# Patient Record
Sex: Male | Born: 1940
Health system: Southern US, Community
[De-identification: ages and names within clinical notes are randomized; demographics above are authoritative.]

## PROBLEM LIST (undated history)

## (undated) DIAGNOSIS — G5601 Carpal tunnel syndrome, right upper limb: Secondary | ICD-10-CM

## (undated) DIAGNOSIS — I6522 Occlusion and stenosis of left carotid artery: Secondary | ICD-10-CM

## (undated) DIAGNOSIS — E7800 Pure hypercholesterolemia, unspecified: Secondary | ICD-10-CM

## (undated) DIAGNOSIS — K219 Gastro-esophageal reflux disease without esophagitis: Secondary | ICD-10-CM

## (undated) DIAGNOSIS — M7121 Synovial cyst of popliteal space [Baker], right knee: Secondary | ICD-10-CM

## (undated) DIAGNOSIS — M1990 Unspecified osteoarthritis, unspecified site: Secondary | ICD-10-CM

## (undated) DIAGNOSIS — J45909 Unspecified asthma, uncomplicated: Secondary | ICD-10-CM

## (undated) DIAGNOSIS — R1310 Dysphagia, unspecified: Secondary | ICD-10-CM

## (undated) DIAGNOSIS — N400 Enlarged prostate without lower urinary tract symptoms: Secondary | ICD-10-CM

## (undated) DIAGNOSIS — R634 Abnormal weight loss: Secondary | ICD-10-CM

## (undated) DIAGNOSIS — R42 Dizziness and giddiness: Secondary | ICD-10-CM

## (undated) DIAGNOSIS — J40 Bronchitis, not specified as acute or chronic: Secondary | ICD-10-CM

## (undated) DIAGNOSIS — N529 Male erectile dysfunction, unspecified: Secondary | ICD-10-CM

## (undated) DIAGNOSIS — G301 Alzheimer's disease with late onset: Secondary | ICD-10-CM

## (undated) DIAGNOSIS — M779 Enthesopathy, unspecified: Secondary | ICD-10-CM

## (undated) DIAGNOSIS — K2990 Gastroduodenitis, unspecified, without bleeding: Secondary | ICD-10-CM

## (undated) DIAGNOSIS — F0280 Dementia in other diseases classified elsewhere without behavioral disturbance: Secondary | ICD-10-CM

## (undated) DIAGNOSIS — J449 Chronic obstructive pulmonary disease, unspecified: Secondary | ICD-10-CM

## (undated) DIAGNOSIS — K222 Esophageal obstruction: Secondary | ICD-10-CM

## (undated) DIAGNOSIS — G5691 Unspecified mononeuropathy of right upper limb: Secondary | ICD-10-CM

## (undated) HISTORY — PX: COLONOSCOPY WITH ESOPHAGOGASTRODUODENOSCOPY (EGD) AND ESOPHAGEAL DILATION (ED): SHX6495

## (undated) HISTORY — PX: HERNIA REPAIR: SHX51

---

## 2004-06-19 ENCOUNTER — Other Ambulatory Visit: Payer: Self-pay

## 2004-06-27 ENCOUNTER — Encounter: Admission: RE | Admit: 2004-06-27 | Discharge: 2004-06-27 | Payer: Self-pay | Admitting: Neurosurgery

## 2004-08-23 ENCOUNTER — Encounter: Admission: RE | Admit: 2004-08-23 | Discharge: 2004-08-23 | Payer: Self-pay | Admitting: Neurosurgery

## 2005-01-23 ENCOUNTER — Ambulatory Visit: Payer: Self-pay | Admitting: Internal Medicine

## 2005-08-08 ENCOUNTER — Ambulatory Visit: Payer: Self-pay

## 2005-11-01 ENCOUNTER — Emergency Department: Payer: Self-pay | Admitting: Emergency Medicine

## 2006-03-28 ENCOUNTER — Other Ambulatory Visit: Payer: Self-pay

## 2006-04-04 ENCOUNTER — Ambulatory Visit: Payer: Self-pay | Admitting: General Surgery

## 2009-11-01 ENCOUNTER — Ambulatory Visit: Payer: Self-pay | Admitting: Family Medicine

## 2012-05-20 ENCOUNTER — Ambulatory Visit: Payer: Self-pay | Admitting: Pain Medicine

## 2012-05-28 ENCOUNTER — Ambulatory Visit: Payer: Self-pay | Admitting: Pain Medicine

## 2014-04-20 DIAGNOSIS — N529 Male erectile dysfunction, unspecified: Secondary | ICD-10-CM | POA: Insufficient documentation

## 2014-04-20 DIAGNOSIS — R1310 Dysphagia, unspecified: Secondary | ICD-10-CM | POA: Insufficient documentation

## 2014-05-07 ENCOUNTER — Ambulatory Visit: Payer: Self-pay | Admitting: Internal Medicine

## 2014-05-11 DIAGNOSIS — R12 Heartburn: Secondary | ICD-10-CM | POA: Insufficient documentation

## 2014-05-11 DIAGNOSIS — R634 Abnormal weight loss: Secondary | ICD-10-CM | POA: Insufficient documentation

## 2014-05-14 ENCOUNTER — Ambulatory Visit: Payer: Self-pay | Admitting: Unknown Physician Specialty

## 2014-05-27 ENCOUNTER — Ambulatory Visit: Payer: Self-pay | Admitting: Unknown Physician Specialty

## 2014-05-28 LAB — PATHOLOGY REPORT

## 2014-06-10 DIAGNOSIS — K2990 Gastroduodenitis, unspecified, without bleeding: Secondary | ICD-10-CM | POA: Insufficient documentation

## 2014-07-28 DIAGNOSIS — M4726 Other spondylosis with radiculopathy, lumbar region: Secondary | ICD-10-CM | POA: Insufficient documentation

## 2014-09-06 DIAGNOSIS — H6123 Impacted cerumen, bilateral: Secondary | ICD-10-CM | POA: Insufficient documentation

## 2014-09-06 DIAGNOSIS — M25521 Pain in right elbow: Secondary | ICD-10-CM | POA: Insufficient documentation

## 2014-09-06 DIAGNOSIS — Z720 Tobacco use: Secondary | ICD-10-CM | POA: Insufficient documentation

## 2014-09-14 ENCOUNTER — Ambulatory Visit: Payer: Self-pay | Admitting: Internal Medicine

## 2014-10-18 DIAGNOSIS — J449 Chronic obstructive pulmonary disease, unspecified: Secondary | ICD-10-CM | POA: Insufficient documentation

## 2015-12-02 DIAGNOSIS — F0280 Dementia in other diseases classified elsewhere without behavioral disturbance: Secondary | ICD-10-CM | POA: Diagnosis not present

## 2015-12-02 DIAGNOSIS — G301 Alzheimer's disease with late onset: Secondary | ICD-10-CM | POA: Diagnosis not present

## 2015-12-02 DIAGNOSIS — J3089 Other allergic rhinitis: Secondary | ICD-10-CM | POA: Insufficient documentation

## 2015-12-02 DIAGNOSIS — R42 Dizziness and giddiness: Secondary | ICD-10-CM | POA: Insufficient documentation

## 2015-12-15 DIAGNOSIS — R42 Dizziness and giddiness: Secondary | ICD-10-CM | POA: Diagnosis not present

## 2015-12-15 DIAGNOSIS — I6523 Occlusion and stenosis of bilateral carotid arteries: Secondary | ICD-10-CM | POA: Diagnosis not present

## 2015-12-19 DIAGNOSIS — N529 Male erectile dysfunction, unspecified: Secondary | ICD-10-CM | POA: Diagnosis not present

## 2015-12-19 DIAGNOSIS — I6522 Occlusion and stenosis of left carotid artery: Secondary | ICD-10-CM | POA: Insufficient documentation

## 2015-12-19 DIAGNOSIS — E7800 Pure hypercholesterolemia, unspecified: Secondary | ICD-10-CM | POA: Diagnosis not present

## 2015-12-19 DIAGNOSIS — Z1159 Encounter for screening for other viral diseases: Secondary | ICD-10-CM | POA: Diagnosis not present

## 2015-12-19 DIAGNOSIS — F0280 Dementia in other diseases classified elsewhere without behavioral disturbance: Secondary | ICD-10-CM | POA: Diagnosis not present

## 2015-12-19 DIAGNOSIS — Z125 Encounter for screening for malignant neoplasm of prostate: Secondary | ICD-10-CM | POA: Diagnosis not present

## 2015-12-19 DIAGNOSIS — G301 Alzheimer's disease with late onset: Secondary | ICD-10-CM | POA: Diagnosis not present

## 2015-12-30 DIAGNOSIS — I6523 Occlusion and stenosis of bilateral carotid arteries: Secondary | ICD-10-CM | POA: Diagnosis not present

## 2015-12-30 DIAGNOSIS — E785 Hyperlipidemia, unspecified: Secondary | ICD-10-CM | POA: Diagnosis not present

## 2015-12-30 DIAGNOSIS — F17200 Nicotine dependence, unspecified, uncomplicated: Secondary | ICD-10-CM | POA: Diagnosis not present

## 2015-12-30 DIAGNOSIS — J449 Chronic obstructive pulmonary disease, unspecified: Secondary | ICD-10-CM | POA: Diagnosis not present

## 2016-01-10 DIAGNOSIS — J209 Acute bronchitis, unspecified: Secondary | ICD-10-CM | POA: Diagnosis not present

## 2016-03-12 DIAGNOSIS — E7800 Pure hypercholesterolemia, unspecified: Secondary | ICD-10-CM | POA: Diagnosis not present

## 2016-03-12 DIAGNOSIS — Z1159 Encounter for screening for other viral diseases: Secondary | ICD-10-CM | POA: Diagnosis not present

## 2016-03-12 DIAGNOSIS — Z125 Encounter for screening for malignant neoplasm of prostate: Secondary | ICD-10-CM | POA: Diagnosis not present

## 2016-03-19 DIAGNOSIS — J449 Chronic obstructive pulmonary disease, unspecified: Secondary | ICD-10-CM | POA: Diagnosis not present

## 2016-03-19 DIAGNOSIS — Z1211 Encounter for screening for malignant neoplasm of colon: Secondary | ICD-10-CM | POA: Diagnosis not present

## 2016-03-19 DIAGNOSIS — I6522 Occlusion and stenosis of left carotid artery: Secondary | ICD-10-CM | POA: Diagnosis not present

## 2016-03-19 DIAGNOSIS — G301 Alzheimer's disease with late onset: Secondary | ICD-10-CM | POA: Diagnosis not present

## 2016-03-19 DIAGNOSIS — F0280 Dementia in other diseases classified elsewhere without behavioral disturbance: Secondary | ICD-10-CM | POA: Diagnosis not present

## 2016-03-19 DIAGNOSIS — N529 Male erectile dysfunction, unspecified: Secondary | ICD-10-CM | POA: Diagnosis not present

## 2016-03-19 DIAGNOSIS — E7800 Pure hypercholesterolemia, unspecified: Secondary | ICD-10-CM | POA: Diagnosis not present

## 2016-04-03 DIAGNOSIS — K5909 Other constipation: Secondary | ICD-10-CM | POA: Diagnosis not present

## 2016-04-17 DIAGNOSIS — X32XXXA Exposure to sunlight, initial encounter: Secondary | ICD-10-CM | POA: Diagnosis not present

## 2016-04-17 DIAGNOSIS — L570 Actinic keratosis: Secondary | ICD-10-CM | POA: Diagnosis not present

## 2016-04-17 DIAGNOSIS — L814 Other melanin hyperpigmentation: Secondary | ICD-10-CM | POA: Diagnosis not present

## 2016-05-10 DIAGNOSIS — M79601 Pain in right arm: Secondary | ICD-10-CM | POA: Diagnosis not present

## 2016-06-01 DIAGNOSIS — M778 Other enthesopathies, not elsewhere classified: Secondary | ICD-10-CM | POA: Diagnosis not present

## 2016-06-01 DIAGNOSIS — R634 Abnormal weight loss: Secondary | ICD-10-CM | POA: Diagnosis not present

## 2016-06-01 DIAGNOSIS — G301 Alzheimer's disease with late onset: Secondary | ICD-10-CM | POA: Diagnosis not present

## 2016-06-01 DIAGNOSIS — F0280 Dementia in other diseases classified elsewhere without behavioral disturbance: Secondary | ICD-10-CM | POA: Diagnosis not present

## 2016-06-01 DIAGNOSIS — J449 Chronic obstructive pulmonary disease, unspecified: Secondary | ICD-10-CM | POA: Diagnosis not present

## 2016-06-02 DIAGNOSIS — M778 Other enthesopathies, not elsewhere classified: Secondary | ICD-10-CM | POA: Insufficient documentation

## 2016-06-12 DIAGNOSIS — M778 Other enthesopathies, not elsewhere classified: Secondary | ICD-10-CM | POA: Diagnosis not present

## 2016-06-12 DIAGNOSIS — M1811 Unilateral primary osteoarthritis of first carpometacarpal joint, right hand: Secondary | ICD-10-CM | POA: Diagnosis not present

## 2016-07-03 DIAGNOSIS — J40 Bronchitis, not specified as acute or chronic: Secondary | ICD-10-CM | POA: Diagnosis not present

## 2016-07-03 DIAGNOSIS — E7800 Pure hypercholesterolemia, unspecified: Secondary | ICD-10-CM | POA: Diagnosis not present

## 2016-07-03 DIAGNOSIS — R5383 Other fatigue: Secondary | ICD-10-CM | POA: Diagnosis not present

## 2016-07-03 DIAGNOSIS — F17200 Nicotine dependence, unspecified, uncomplicated: Secondary | ICD-10-CM | POA: Diagnosis not present

## 2016-07-03 DIAGNOSIS — R634 Abnormal weight loss: Secondary | ICD-10-CM | POA: Diagnosis not present

## 2016-07-03 DIAGNOSIS — R05 Cough: Secondary | ICD-10-CM | POA: Diagnosis not present

## 2016-07-03 DIAGNOSIS — I6523 Occlusion and stenosis of bilateral carotid arteries: Secondary | ICD-10-CM | POA: Diagnosis not present

## 2016-07-03 DIAGNOSIS — E785 Hyperlipidemia, unspecified: Secondary | ICD-10-CM | POA: Diagnosis not present

## 2016-07-03 DIAGNOSIS — I6522 Occlusion and stenosis of left carotid artery: Secondary | ICD-10-CM | POA: Diagnosis not present

## 2016-07-03 DIAGNOSIS — J449 Chronic obstructive pulmonary disease, unspecified: Secondary | ICD-10-CM | POA: Diagnosis not present

## 2016-07-24 DIAGNOSIS — G5691 Unspecified mononeuropathy of right upper limb: Secondary | ICD-10-CM | POA: Insufficient documentation

## 2016-07-24 DIAGNOSIS — I6522 Occlusion and stenosis of left carotid artery: Secondary | ICD-10-CM | POA: Diagnosis not present

## 2016-07-24 DIAGNOSIS — G301 Alzheimer's disease with late onset: Secondary | ICD-10-CM | POA: Diagnosis not present

## 2016-07-24 DIAGNOSIS — E7800 Pure hypercholesterolemia, unspecified: Secondary | ICD-10-CM | POA: Diagnosis not present

## 2016-07-24 DIAGNOSIS — F0280 Dementia in other diseases classified elsewhere without behavioral disturbance: Secondary | ICD-10-CM | POA: Diagnosis not present

## 2016-08-08 DIAGNOSIS — G5601 Carpal tunnel syndrome, right upper limb: Secondary | ICD-10-CM | POA: Diagnosis not present

## 2016-09-04 DIAGNOSIS — G5601 Carpal tunnel syndrome, right upper limb: Secondary | ICD-10-CM | POA: Diagnosis not present

## 2016-09-13 DIAGNOSIS — S39012A Strain of muscle, fascia and tendon of lower back, initial encounter: Secondary | ICD-10-CM | POA: Diagnosis not present

## 2016-09-13 DIAGNOSIS — N529 Male erectile dysfunction, unspecified: Secondary | ICD-10-CM | POA: Diagnosis not present

## 2016-09-13 DIAGNOSIS — G5601 Carpal tunnel syndrome, right upper limb: Secondary | ICD-10-CM | POA: Insufficient documentation

## 2016-10-02 DIAGNOSIS — G5603 Carpal tunnel syndrome, bilateral upper limbs: Secondary | ICD-10-CM | POA: Diagnosis not present

## 2016-10-02 DIAGNOSIS — M25531 Pain in right wrist: Secondary | ICD-10-CM | POA: Diagnosis not present

## 2016-10-24 ENCOUNTER — Emergency Department
Admission: EM | Admit: 2016-10-24 | Discharge: 2016-10-24 | Disposition: A | Discharge status: Home / Self Care | Payer: PPO | Attending: Emergency Medicine | Admitting: Emergency Medicine

## 2016-10-24 DIAGNOSIS — F17200 Nicotine dependence, unspecified, uncomplicated: Secondary | ICD-10-CM | POA: Insufficient documentation

## 2016-10-24 DIAGNOSIS — K222 Esophageal obstruction: Secondary | ICD-10-CM | POA: Diagnosis not present

## 2016-10-24 DIAGNOSIS — Z79899 Other long term (current) drug therapy: Secondary | ICD-10-CM | POA: Insufficient documentation

## 2016-10-24 DIAGNOSIS — J449 Chronic obstructive pulmonary disease, unspecified: Secondary | ICD-10-CM | POA: Insufficient documentation

## 2016-10-24 DIAGNOSIS — R1310 Dysphagia, unspecified: Secondary | ICD-10-CM | POA: Diagnosis not present

## 2016-10-24 LAB — CBC
HEMATOCRIT: 47.4 % (ref 40.0–52.0)
HEMOGLOBIN: 16.3 g/dL (ref 13.0–18.0)
MCH: 30.8 pg (ref 26.0–34.0)
MCHC: 34.4 g/dL (ref 32.0–36.0)
MCV: 89.4 fL (ref 80.0–100.0)
Platelets: 270 10*3/uL (ref 150–440)
RBC: 5.3 MIL/uL (ref 4.40–5.90)
RDW: 13.3 % (ref 11.5–14.5)
WBC: 7.7 10*3/uL (ref 3.8–10.6)

## 2016-10-24 LAB — COMPREHENSIVE METABOLIC PANEL
ALBUMIN: 4.1 g/dL (ref 3.5–5.0)
ALK PHOS: 68 U/L (ref 38–126)
ALT: 22 U/L (ref 17–63)
ANION GAP: 5 (ref 5–15)
AST: 32 U/L (ref 15–41)
BILIRUBIN TOTAL: 1 mg/dL (ref 0.3–1.2)
BUN: 24 mg/dL — ABNORMAL HIGH (ref 6–20)
CALCIUM: 9.7 mg/dL (ref 8.9–10.3)
CO2: 27 mmol/L (ref 22–32)
CREATININE: 1.13 mg/dL (ref 0.61–1.24)
Chloride: 109 mmol/L (ref 101–111)
GFR calc non Af Amer: >60 mL/min (ref >60)
GLUCOSE: 98 mg/dL (ref 65–99)
Potassium: 4.4 mmol/L (ref 3.5–5.1)
Sodium: 141 mmol/L (ref 135–145)
TOTAL PROTEIN: 6.7 g/dL (ref 6.5–8.1)

## 2016-10-24 LAB — TROPONIN I: Troponin I: <0.03 ng/mL (ref <0.03)

## 2016-10-24 NOTE — ED Triage Notes (Signed)
Reports trouble in the past with getting food stuck in throat.  States food has been stuck x 2 wks now but getting more difficult to swallow.  No resp distress, no drooling.

## 2016-10-24 NOTE — Discharge Instructions (Signed)
Please eat a full liquid diet, Dr. Percell [place] office will contact you

## 2016-10-24 NOTE — ED Provider Notes (Signed)
[facility] Emergency Department Provider Note   ____________________________________________    I have reviewed the triage vital signs and the nursing notes.   HISTORY  Chief Complaint Dysphagia     HPI James [name] is a 75 y.o. male who presents with complaints of difficulty swallowing. Patient reports he has a long history of swallowing difficulty and has had his esophagus stretched twice before by Dr. Vira Agar. He reports his swallowing has gotten worse over the last 2 weeks and today he thought food was stuck but he was able to get through. He had a mild burning pain as well which has resolved. He reports he is able to get liquid through slowly and generally chews his food very well. He denies nausea or vomiting. No chest pain. He feels well currently and has no complaints. They called GI office this morning who referred them to the emergency department   No past medical history on file.  There are no active problems to display for this patient.   No past surgical history on file.  Prior to Admission medications   Medication Sig Start Date End Date Taking? Authorizing Provider  donepezil (ARICEPT) 23 MG TABS tablet Take 23 mg by mouth at bedtime. 07/24/16 07/24/17  Historical Provider, MD  pravastatin (PRAVACHOL) 10 MG tablet Take 10 mg by mouth at bedtime. 07/24/16 07/24/17  Historical Provider, MD  tiZANidine (ZANAFLEX) 2 MG tablet Take 2 mg by mouth 3 (three) times daily. 09/13/16 09/13/17  Historical Provider, MD     Allergies Aspirin  No family history on file.  Social History Social History  Substance Use Topics  . Smoking status: Not on file  . Smokeless tobacco: Not on file  . Alcohol use Not on file    Review of Systems  Constitutional: No fever/chills  ENT: No sore throat. Cardiovascular: As above Respiratory: Denies shortness of breath. Gastrointestinal: No abdominal pain.  No nausea, no vomiting.    Musculoskeletal:  Negative for back pain. Skin: Negative for rash.   10-point ROS otherwise negative.  ____________________________________________   PHYSICAL EXAM:  VITAL SIGNS: ED Triage Vitals  Enc Vitals Group     BP 10/24/16 0928 134/80     Pulse Rate 10/24/16 0928 79     Resp 10/24/16 0928 18     Temp 10/24/16 0928 98.1 F (36.7 C)     Temp Source 10/24/16 0928 Oral     SpO2 10/24/16 0928 98 %     Weight 10/24/16 0929 145 lb (65.8 kg)     Height 10/24/16 0929 5\' 11"  (1.803 m)     Head Circumference --      Peak Flow --      Pain Score 10/24/16 0929 0     Pain Loc --      Pain Edu? --      Excl. in [place]? --     Constitutional: Alert and oriented. No acute distress. Pleasant and interactive Eyes: Conjunctivae are normal.   Nose: No congestion/rhinnorhea. Mouth/Throat: Mucous membranes are moist.    Cardiovascular: Normal rate, regular rhythm.   Good peripheral circulation. Respiratory: Normal respiratory effort.  No retractions. Gastrointestinal: Soft and nontender. No distention.  No CVA tenderness. Genitourinary: deferred Musculoskeletal: No lower extremity tenderness nor edema.  Warm and well perfused Neurologic:  Normal speech and language. No gross focal neurologic deficits are appreciated.  Skin:  Skin is warm, dry and intact. No rash noted. Psychiatric: Mood and affect are normal. Speech and  behavior are normal.  ____________________________________________   LABS (all labs ordered are listed, but only abnormal results are displayed)  Labs Reviewed  COMPREHENSIVE METABOLIC PANEL - Abnormal; Notable for the following:       Result Value   BUN 24 (*)    All other components within normal limits  CBC  TROPONIN I   ____________________________________________  EKG  ED ECG REPORT I, Lavonia Drafts, the attending physician, personally viewed and interpreted this ECG.  Date: 10/24/2016 EKG Time: 9:42 AM Rate: 68 Rhythm: normal sinus rhythm QRS Axis:  normal Intervals: normal ST/T Wave abnormalities: normal Conduction Disturbances: none Narrative Interpretation: unremarkable  ____________________________________________  RADIOLOGY  none ____________________________________________   PROCEDURES  Procedure(s) performed: No    Critical Care performed: No ____________________________________________   INITIAL IMPRESSION / ASSESSMENT AND PLAN / ED COURSE  Pertinent labs & imaging results that were available during my care of the patient were reviewed by me and considered in my medical decision making (see chart for details).  Patient well-appearing and in no acute distress. He is quite comfortable in the ED. We will check labs, including troponin and EKG although very much doubt ACS, this is quite consistent with temporary food impaction. If labs unremarkable will refer for GI outpatient procedure   Clinical Course   Patient is doing well and these be asymptomatic. Lab work is unremarkable. EKG is normal. We'll discuss with GI and arrange outpatient follow-up.  Discussed with Dr. Vira Agar his office will arrange follow up ____________________________________________   FINAL CLINICAL IMPRESSION(S) / ED DIAGNOSES  Final diagnoses:  Esophageal stricture      NEW MEDICATIONS STARTED DURING THIS VISIT:  New Prescriptions   No medications on file     Note:  This document was prepared using Dragon voice recognition software and may include unintentional dictation errors.    Lavonia Drafts, MD 10/24/16 [phone]

## 2016-10-24 NOTE — ED Notes (Signed)
ED Provider at bedside. 

## 2016-10-25 ENCOUNTER — Encounter: Payer: Self-pay | Admitting: *Deleted

## 2016-10-25 DIAGNOSIS — K219 Gastro-esophageal reflux disease without esophagitis: Secondary | ICD-10-CM | POA: Diagnosis not present

## 2016-10-25 DIAGNOSIS — R1310 Dysphagia, unspecified: Secondary | ICD-10-CM | POA: Diagnosis not present

## 2016-10-25 DIAGNOSIS — K222 Esophageal obstruction: Secondary | ICD-10-CM | POA: Diagnosis not present

## 2016-10-26 ENCOUNTER — Ambulatory Visit
Admission: RE | Admit: 2016-10-26 | Discharge: 2016-10-26 | Disposition: A | Discharge status: Home / Self Care | Payer: PPO | Source: Ambulatory Visit | Attending: Unknown Physician Specialty | Admitting: Unknown Physician Specialty

## 2016-10-26 ENCOUNTER — Encounter: Payer: Self-pay | Admitting: Anesthesiology

## 2016-10-26 ENCOUNTER — Ambulatory Visit: Payer: PPO | Admitting: Anesthesiology

## 2016-10-26 ENCOUNTER — Encounter
Admission: RE | Disposition: A | Discharge status: Home / Self Care | Payer: Self-pay | Source: Ambulatory Visit | Attending: Unknown Physician Specialty

## 2016-10-26 DIAGNOSIS — K2950 Unspecified chronic gastritis without bleeding: Secondary | ICD-10-CM | POA: Diagnosis not present

## 2016-10-26 DIAGNOSIS — G301 Alzheimer's disease with late onset: Secondary | ICD-10-CM | POA: Insufficient documentation

## 2016-10-26 DIAGNOSIS — K219 Gastro-esophageal reflux disease without esophagitis: Secondary | ICD-10-CM | POA: Diagnosis not present

## 2016-10-26 DIAGNOSIS — F419 Anxiety disorder, unspecified: Secondary | ICD-10-CM | POA: Insufficient documentation

## 2016-10-26 DIAGNOSIS — Z7982 Long term (current) use of aspirin: Secondary | ICD-10-CM | POA: Diagnosis not present

## 2016-10-26 DIAGNOSIS — K2970 Gastritis, unspecified, without bleeding: Secondary | ICD-10-CM | POA: Diagnosis not present

## 2016-10-26 DIAGNOSIS — N529 Male erectile dysfunction, unspecified: Secondary | ICD-10-CM | POA: Insufficient documentation

## 2016-10-26 DIAGNOSIS — F0280 Dementia in other diseases classified elsewhere without behavioral disturbance: Secondary | ICD-10-CM | POA: Insufficient documentation

## 2016-10-26 DIAGNOSIS — J449 Chronic obstructive pulmonary disease, unspecified: Secondary | ICD-10-CM | POA: Insufficient documentation

## 2016-10-26 DIAGNOSIS — E7800 Pure hypercholesterolemia, unspecified: Secondary | ICD-10-CM | POA: Insufficient documentation

## 2016-10-26 DIAGNOSIS — F17210 Nicotine dependence, cigarettes, uncomplicated: Secondary | ICD-10-CM | POA: Diagnosis not present

## 2016-10-26 DIAGNOSIS — K222 Esophageal obstruction: Secondary | ICD-10-CM | POA: Insufficient documentation

## 2016-10-26 DIAGNOSIS — M1990 Unspecified osteoarthritis, unspecified site: Secondary | ICD-10-CM | POA: Diagnosis not present

## 2016-10-26 DIAGNOSIS — K269 Duodenal ulcer, unspecified as acute or chronic, without hemorrhage or perforation: Secondary | ICD-10-CM | POA: Diagnosis not present

## 2016-10-26 DIAGNOSIS — R1310 Dysphagia, unspecified: Secondary | ICD-10-CM | POA: Diagnosis not present

## 2016-10-26 HISTORY — DX: Alzheimer's disease with late onset: G30.1

## 2016-10-26 HISTORY — DX: Unspecified osteoarthritis, unspecified site: M19.90

## 2016-10-26 HISTORY — DX: Enthesopathy, unspecified: M77.9

## 2016-10-26 HISTORY — DX: Male erectile dysfunction, unspecified: N52.9

## 2016-10-26 HISTORY — DX: Pure hypercholesterolemia, unspecified: E78.00

## 2016-10-26 HISTORY — DX: Gastro-esophageal reflux disease without esophagitis: K21.9

## 2016-10-26 HISTORY — DX: Abnormal weight loss: R63.4

## 2016-10-26 HISTORY — DX: Dysphagia, unspecified: R13.10

## 2016-10-26 HISTORY — DX: Bronchitis, not specified as acute or chronic: J40

## 2016-10-26 HISTORY — DX: Chronic obstructive pulmonary disease, unspecified: J44.9

## 2016-10-26 HISTORY — PX: ESOPHAGOGASTRODUODENOSCOPY (EGD) WITH PROPOFOL: SHX5813

## 2016-10-26 HISTORY — PX: SAVORY DILATION: SHX5439

## 2016-10-26 HISTORY — DX: Dementia in other diseases classified elsewhere without behavioral disturbance: F02.80

## 2016-10-26 SURGERY — ESOPHAGOGASTRODUODENOSCOPY (EGD) WITH PROPOFOL
Anesthesia: General

## 2016-10-26 MED ORDER — SODIUM CHLORIDE 0.9 % IV SOLN: INTRAVENOUS | Status: DC

## 2016-10-26 MED ORDER — LIDOCAINE VISCOUS 2 % MT SOLN
15.0000 mL | Freq: Once | OROMUCOSAL | Status: AC
  Administered 2016-10-26: 15 mL via OROMUCOSAL
  Filled 2016-10-26: qty 15

## 2016-10-26 MED ORDER — SODIUM CHLORIDE 0.9 % IV SOLN
INTRAVENOUS | Status: DC
  Administered 2016-10-26: 14:00:00 via INTRAVENOUS

## 2016-10-26 MED ORDER — PROPOFOL 500 MG/50ML IV EMUL
INTRAVENOUS | Status: AC
  Filled 2016-10-26: qty 50

## 2016-10-26 MED ORDER — LIDOCAINE 2% (20 MG/ML) 5 ML SYRINGE
INTRAMUSCULAR | Status: DC | PRN
  Administered 2016-10-26: 50 mg via INTRAVENOUS

## 2016-10-26 MED ORDER — LIDOCAINE 2% (20 MG/ML) 5 ML SYRINGE
INTRAMUSCULAR | Status: AC
  Filled 2016-10-26: qty 5

## 2016-10-26 MED ORDER — PROPOFOL 500 MG/50ML IV EMUL
INTRAVENOUS | Status: DC | PRN
Start: 2016-10-26 — End: 2016-10-26
  Administered 2016-10-26: 120 ug/kg/min via INTRAVENOUS

## 2016-10-26 MED ORDER — GLYCOPYRROLATE 0.2 MG/ML IJ SOLN
INTRAMUSCULAR | Status: DC | PRN
  Administered 2016-10-26: 0.2 mg via INTRAVENOUS

## 2016-10-26 MED ORDER — GLYCOPYRROLATE 0.2 MG/ML IJ SOLN
INTRAMUSCULAR | Status: AC
  Filled 2016-10-26: qty 1

## 2016-10-26 MED ORDER — PROPOFOL 10 MG/ML IV BOLUS
INTRAVENOUS | Status: DC | PRN
  Administered 2016-10-26: 70 mg via INTRAVENOUS
  Administered 2016-10-26: 30 mg via INTRAVENOUS

## 2016-10-26 MED ORDER — SODIUM CHLORIDE 0.9 % IV SOLN
INTRAVENOUS | Status: DC
  Administered 2016-10-26: 13:00:00 via INTRAVENOUS

## 2016-10-26 NOTE — Progress Notes (Signed)
Patient c/o pain s/p procedure Dr.Elliott into assess patient avss at this time - family present at beside MIVF continues - see MAR for full details

## 2016-10-26 NOTE — Progress Notes (Signed)
Diet order Clears for 24hrs - room temp h20 and apple juice Soft for 1 week - potato - MD and Nurse discussed food options with family and patient Prescriptions given per MD

## 2016-10-26 NOTE — H&P (Signed)
Primary Care Physician:  No PCP Per Patient Primary Gastroenterologist:  Dr. Vira Agar  Pre-Procedure History & Physical: HPI:  James [name] is a 75 y.o. male is here for an endoscopy.   Past Medical History:  Diagnosis Date  . Arthritis   . Bronchitis   . COPD (chronic obstructive pulmonary disease) ([place])   . Dysphagia   . Elevated cholesterol   . Erectile dysfunction   . GERD (gastroesophageal reflux disease)   . SDAT (senile dementia of Alzheimer's type)   . Tendinitis   . Weight loss     Past Surgical History:  Procedure Laterality Date  . COLONOSCOPY WITH ESOPHAGOGASTRODUODENOSCOPY (EGD) AND ESOPHAGEAL DILATION (ED)    . HERNIA REPAIR      Prior to Admission medications   Medication Sig Start Date End Date Taking? Authorizing Provider  albuterol (PROVENTIL) (2.5 MG/3ML) 0.083% nebulizer solution Take 2.5 mg by nebulization every 6 (six) hours as needed for wheezing or shortness of breath.   Yes Historical Provider, MD  predniSONE (DELTASONE) 20 MG tablet Take 20 mg by mouth daily with breakfast.   Yes Historical Provider, MD  donepezil (ARICEPT) 23 MG TABS tablet Take 23 mg by mouth at bedtime. 07/24/16 07/24/17  Historical Provider, MD  omeprazole (PRILOSEC) 20 MG capsule Take 20 mg by mouth daily.    Historical Provider, MD  pravastatin (PRAVACHOL) 10 MG tablet Take 10 mg by mouth at bedtime. 07/24/16 07/24/17  Historical Provider, MD  tadalafil (CIALIS) 5 MG tablet Take 5 mg by mouth daily as needed for erectile dysfunction.    Historical Provider, MD  tiotropium (SPIRIVA) 18 MCG inhalation capsule Place 18 mcg into inhaler and inhale daily.    Historical Provider, MD  tiZANidine (ZANAFLEX) 2 MG tablet Take 2 mg by mouth 3 (three) times daily. 09/13/16 09/13/17  Historical Provider, MD    Allergies as of 10/25/2016 - Review Complete 10/25/2016  Allergen Reaction Noted  . Aspirin Swelling 10/24/2016    History reviewed. No pertinent family history.  Social History    Social History  . Marital status: Unknown    Spouse name: N/A  . Number of children: N/A  . Years of education: N/A   Occupational History  . Not on file.   Social History Main Topics  . Smoking status: Current Every Day Smoker    Packs/day: 0.50  . Smokeless tobacco: Never Used  . Alcohol use Not on file  . Drug use: No  . Sexual activity: Not on file   Other Topics Concern  . Not on file   Social History Narrative  . No narrative on file    Review of Systems: See HPI, otherwise negative ROS  Physical Exam: BP (!) 142/86   Pulse 64   Temp (!) 96.5 F (35.8 C)   Resp 17   Ht 5\' 11"  (1.803 m)   Wt 66.2 kg (146 lb)   SpO2 100%   BMI 20.36 kg/m  General:   Alert,  pleasant and cooperative in NAD Head:  Normocephalic and atraumatic. Neck:  Supple; no masses or thyromegaly. Lungs:  Clear throughout to auscultation.    Heart:  Regular rate and rhythm. Abdomen:  Soft, nontender and nondistended. Normal bowel sounds, without guarding, and without rebound.   Neurologic:  Alert and  oriented x4;  grossly normal neurologically.  Impression/Plan: James [name] is here for an endoscopy to be performed for dysphagia and needs dilatation.   Risks, benefits, limitations, and alternatives regarding  endoscopy  have been reviewed with the patient.  Questions have been answered.  All parties agreeable.   Gaylyn Cheers, MD  10/26/2016, 4:52 PM

## 2016-10-26 NOTE — Anesthesia Postprocedure Evaluation (Signed)
Anesthesia Post Note  Patient: James [name]  Procedure(s) Performed: Procedure(s) (LRB): ESOPHAGOGASTRODUODENOSCOPY (EGD) WITH PROPOFOL (N/A) SAVORY DILATION (N/A)  Patient location during evaluation: Endoscopy Anesthesia Type: General Level of consciousness: awake and alert Pain management: pain level controlled Vital Signs Assessment: post-procedure vital signs reviewed and stable Respiratory status: spontaneous breathing, nonlabored ventilation, respiratory function stable and patient connected to nasal cannula oxygen Cardiovascular status: blood pressure returned to baseline and stable Postop Assessment: no signs of nausea or vomiting Anesthetic complications: no     Last Vitals:  Vitals:   10/26/16 1540 10/26/16 1550  BP:    Pulse: 65 64  Resp: 16 17  Temp:      Last Pain:  Vitals:   10/26/16 1500  TempSrc:   PainSc: [name]

## 2016-10-26 NOTE — Transfer of Care (Signed)
Immediate Anesthesia Transfer of Care Note  Patient: James [name]  Procedure(s) Performed: Procedure(s): ESOPHAGOGASTRODUODENOSCOPY (EGD) WITH PROPOFOL (N/A) SAVORY DILATION (N/A)  Patient Location: Endoscopy Unit  Anesthesia Type:General  Level of Consciousness: awake  Airway & Oxygen Therapy: Patient Spontanous Breathing and Patient connected to nasal cannula oxygen  Post-op Assessment: Report given to RN and Post -op Vital signs reviewed and stable  Post vital signs: Reviewed  Last Vitals:  Vitals:   10/26/16 1256 10/26/16 1412  BP: (!) 142/90 (!) 159/87  Pulse: 81 82  Resp:  16  Temp: (!) 35.4 C (!) 35.9 C    Last Pain:  Vitals:   10/26/16 1256  TempSrc: Tympanic         Complications: No apparent anesthesia complications

## 2016-10-26 NOTE — Progress Notes (Signed)
Dr. Vira Agar at beside patient to remain on clear liquids at this time - patient to remain in recovery to monitor per MD

## 2016-10-26 NOTE — Op Note (Signed)
[facility] Patient Name: James [name] Procedure Date: 10/26/2016 1:41 PM MRN: [id number] Account #: [id number] Date of Birth: November 23, 1940 Admit Type: Outpatient Age: 75 Room: [facility] ENDO ROOM 1 Gender: Male Note Status: Finalized Procedure:            Upper GI endoscopy Indications:          Dysphagia Providers:            Manya Silvas, MD Referring MD:         Glendon Axe (Referring MD) Medicines:            Propofol per Anesthesia Complications:        No immediate complications. Procedure:            Pre-Anesthesia Assessment:                       - After reviewing the risks and benefits, the patient                        was deemed in satisfactory condition to undergo the                        procedure.                       After obtaining informed consent, the endoscope was                        passed under direct vision. Throughout the procedure,                        the patient's blood pressure, pulse, and oxygen                        saturations were monitored continuously. The                        Colonoscope was introduced through the mouth, and                        advanced to the second part of duodenum. The upper GI                        endoscopy was accomplished without difficulty. The                        patient tolerated the procedure well. Findings:      A moderate Schatzki ring (acquired) was found at the gastroesophageal       junction. At the end of the procedure A guidewire was placed and the       scope was withdrawn. Dilation was performed with a Savary dilator with       moderate resistance at 15 mm, 16 mm and 17 mm.      Diffuse and patchy mild inflammation characterized by erythema and       granularity was found in the gastric body.      Many non-bleeding superficial duodenal ulcers with no stigmata of       bleeding were found in the duodenal bulb. The largest lesion was 3 mm in   largest dimension. Impression:           -  Moderate Schatzki ring. Dilated.                       - Gastritis.                       - Multiple non-bleeding duodenal ulcers with no                        stigmata of bleeding.                       - No specimens collected. Recommendation:       - The findings and recommendations were discussed with                        the patient's family. Very soft food, take medicine to                        stop acid production. Manya Silvas, MD 10/26/2016 2:12:22 PM This report has been signed electronically. Number of Addenda: 0 Note Initiated On: 10/26/2016 1:41 PM      [facility]

## 2016-10-26 NOTE — Anesthesia Preprocedure Evaluation (Addendum)
Anesthesia Evaluation  Patient identified by MRN, date of birth, ID band Patient awake    Reviewed: Allergy & Precautions, NPO status , Patient's Chart, lab work & pertinent test results, reviewed documented beta blocker date and time   Airway Mallampati: II  TM Distance: >3 FB     Dental  (+) Chipped, Partial Upper   Pulmonary COPD, Current Smoker,           Cardiovascular      Neuro/Psych Anxiety    GI/Hepatic GERD  ,  Endo/Other    Renal/GU      Musculoskeletal  (+) Arthritis ,   Abdominal   Peds  Hematology   Anesthesia Other Findings   Reproductive/Obstetrics                            Anesthesia Physical Anesthesia Plan  ASA: III  Anesthesia Plan: General   Post-op Pain Management:    Induction: Intravenous  Airway Management Planned: Nasal Cannula  Additional Equipment:   Intra-op Plan:   Post-operative Plan:   Informed Consent: I have reviewed the patients History and Physical, chart, labs and discussed the procedure including the risks, benefits and alternatives for the proposed anesthesia with the patient or authorized representative who has indicated his/her understanding and acceptance.     Plan Discussed with: CRNA  Anesthesia Plan Comments:         Anesthesia Quick Evaluation

## 2016-10-30 ENCOUNTER — Encounter: Payer: Self-pay | Admitting: Unknown Physician Specialty

## 2016-11-23 DIAGNOSIS — G5601 Carpal tunnel syndrome, right upper limb: Secondary | ICD-10-CM | POA: Diagnosis not present

## 2016-12-21 DIAGNOSIS — E7800 Pure hypercholesterolemia, unspecified: Secondary | ICD-10-CM | POA: Diagnosis not present

## 2016-12-21 DIAGNOSIS — M5442 Lumbago with sciatica, left side: Secondary | ICD-10-CM | POA: Diagnosis not present

## 2016-12-21 DIAGNOSIS — G8929 Other chronic pain: Secondary | ICD-10-CM | POA: Diagnosis not present

## 2016-12-21 DIAGNOSIS — I6522 Occlusion and stenosis of left carotid artery: Secondary | ICD-10-CM | POA: Diagnosis not present

## 2016-12-21 DIAGNOSIS — M545 Low back pain: Secondary | ICD-10-CM | POA: Diagnosis not present

## 2016-12-21 DIAGNOSIS — J441 Chronic obstructive pulmonary disease with (acute) exacerbation: Secondary | ICD-10-CM | POA: Diagnosis not present

## 2016-12-24 ENCOUNTER — Other Ambulatory Visit: Payer: Self-pay | Admitting: Internal Medicine

## 2016-12-24 DIAGNOSIS — M4726 Other spondylosis with radiculopathy, lumbar region: Secondary | ICD-10-CM

## 2016-12-29 NOTE — Discharge Instructions (Signed)
°  Instructions after Hand / Wrist Surgery   Daylene Vandenbosch P. Holley Bouche., M.D.  Dept. of [facility]  [address] [place], [place]  [address]   Phone: [phone]   Fax: [phone]   DIET:  Drink plenty of non-alcoholic fluids & begin a light diet.  Resume your normal diet the day after surgery.  ACTIVITY:   Keep the hand elevated above the level of the elbow.  Begin gently moving the fingers on a regular basis to avoid stiffness.  Avoid any heavy lifting, pushing, or pulling with the operative hand.  Do not drive or operate any equipment until instructed.  WOUND CARE:   Keep the splint/bandage clean and dry.   The splint and stitches will be removed in the office.  Continue to use the ice packs periodically to reduce pain and swelling.  You may bathe or shower after the stitches are removed at the first office visit following surgery.  MEDICATIONS:  You may resume your regular medications.  Please take the pain medication as prescribed.  Do not take pain medication on an empty stomach.  Do not drive or drink alcoholic beverages when taking pain medications.  CALL THE OFFICE FOR:  Temperature above 101 degrees  Excessive bleeding or drainage on the dressing.  Excessive swelling, coldness, or paleness of the fingers.  Persistent nausea and vomiting.  FOLLOW-UP:   You should have an appointment to return to the office in 7-10 days after surgery.   REMEMBER: R.I.C.E. = Rest, Ice, Compression, Elevation !

## 2017-01-02 ENCOUNTER — Ambulatory Visit: Payer: PPO

## 2017-01-02 ENCOUNTER — Inpatient Hospital Stay: Admission: RE | Admit: 2017-01-02 | Payer: PPO | Source: Ambulatory Visit

## 2017-01-04 ENCOUNTER — Encounter
Admission: RE | Admit: 2017-01-04 | Discharge: 2017-01-04 | Disposition: A | Discharge status: Home / Self Care | Payer: PPO | Source: Ambulatory Visit | Attending: Orthopedic Surgery | Admitting: Orthopedic Surgery

## 2017-01-04 DIAGNOSIS — Z01818 Encounter for other preprocedural examination: Secondary | ICD-10-CM | POA: Insufficient documentation

## 2017-01-04 HISTORY — DX: Esophageal obstruction: K22.2

## 2017-01-04 NOTE — Patient Instructions (Signed)
Your procedure is scheduled on: Wednesday 01/09/17 Report to [facility]. 2ND FLOOR MEDICAL MALL ENTRANCE. To find out your arrival time please call [phone] between 1PM - 3PM on Tuesday 01/08/17.  Remember: Instructions that are not followed completely may result in serious medical risk, up to and including death, or upon the discretion of your surgeon and anesthesiologist your surgery may need to be rescheduled.    __X__ 1. Do not eat food or drink liquids after midnight. No gum chewing or hard candies.     __X__ 2. No Alcohol for 24 hours before or after surgery.   ____ 3. Bring all medications with you on the day of surgery if instructed.    __X__ 4. Notify your doctor if there is any change in your medical condition     (cold, fever, infections).             ___X__5. No smoking within 24 hours of your surgery.     Do not wear jewelry, make-up, hairpins, clips or nail polish.  Do not wear lotions, powders, or perfumes.   Do not shave 48 hours prior to surgery. Men may shave face and neck.  Do not bring valuables to the hospital.    [facility] is not responsible for any belongings or valuables.               Contacts, dentures or bridgework may not be worn into surgery.  Leave your suitcase in the car. After surgery it may be brought to your room.  For patients admitted to the hospital, discharge time is determined by your                treatment team.   Patients discharged the day of surgery will not be allowed to drive home.   Please read over the following fact sheets that you were given:   Pain Booklet and MRSA Information   ____ Take these medicines the morning of surgery with A SIP OF WATER:    1. NONE  2.   3.   4.  5.  6.  ____ Fleet Enema (as directed)   __X__ Use CHG Soap as directed  __X__ Use inhalers on the day of surgery  ____ Stop metformin 2 days prior to surgery    ____ Take 1/2 of usual insulin dose the night before surgery and none on the  morning of surgery.   ____ Stop Coumadin/Plavix/aspirin on   __X__ Stop Anti-inflammatories such as Advil, Aleve, Ibuprofen, Motrin, Naproxen, Naprosyn, Goodies,powder, or aspirin products.  OK to take Tylenol.   ____ Stop supplements until after surgery.    ____ Bring C-Pap to the hospital.

## 2017-01-05 ENCOUNTER — Ambulatory Visit: Payer: PPO

## 2017-01-09 ENCOUNTER — Encounter: Admission: RE | Payer: Self-pay | Source: Ambulatory Visit

## 2017-01-09 ENCOUNTER — Ambulatory Visit: Admission: RE | Admit: 2017-01-09 | Payer: PPO | Source: Ambulatory Visit | Admitting: Orthopedic Surgery

## 2017-01-09 SURGERY — CARPAL TUNNEL RELEASE
Anesthesia: Choice | Laterality: Right

## 2017-01-11 ENCOUNTER — Ambulatory Visit
Admission: RE | Admit: 2017-01-11 | Discharge: 2017-01-11 | Disposition: A | Discharge status: Home / Self Care | Payer: PPO | Source: Ambulatory Visit | Attending: Internal Medicine | Admitting: Internal Medicine

## 2017-01-11 DIAGNOSIS — M5116 Intervertebral disc disorders with radiculopathy, lumbar region: Secondary | ICD-10-CM | POA: Diagnosis not present

## 2017-01-11 DIAGNOSIS — M79605 Pain in left leg: Secondary | ICD-10-CM | POA: Diagnosis not present

## 2017-01-11 DIAGNOSIS — M48061 Spinal stenosis, lumbar region without neurogenic claudication: Secondary | ICD-10-CM | POA: Diagnosis not present

## 2017-01-11 DIAGNOSIS — M4186 Other forms of scoliosis, lumbar region: Secondary | ICD-10-CM | POA: Diagnosis not present

## 2017-01-11 DIAGNOSIS — M4726 Other spondylosis with radiculopathy, lumbar region: Secondary | ICD-10-CM | POA: Insufficient documentation

## 2017-01-11 LAB — POCT I-STAT CREATININE: Creatinine, Ser: 1.1 mg/dL (ref 0.61–1.24)

## 2017-01-11 MED ORDER — GADOBENATE DIMEGLUMINE 529 MG/ML IV SOLN
13.0000 mL | Freq: Once | INTRAVENOUS | Status: AC | PRN
  Administered 2017-01-11: 13 mL via INTRAVENOUS

## 2017-01-22 DIAGNOSIS — J449 Chronic obstructive pulmonary disease, unspecified: Secondary | ICD-10-CM | POA: Diagnosis not present

## 2017-01-22 DIAGNOSIS — J209 Acute bronchitis, unspecified: Secondary | ICD-10-CM | POA: Diagnosis not present

## 2017-01-29 DIAGNOSIS — R0602 Shortness of breath: Secondary | ICD-10-CM | POA: Diagnosis not present

## 2017-01-29 DIAGNOSIS — R05 Cough: Secondary | ICD-10-CM | POA: Diagnosis not present

## 2017-01-29 DIAGNOSIS — J40 Bronchitis, not specified as acute or chronic: Secondary | ICD-10-CM | POA: Diagnosis not present

## 2017-01-29 DIAGNOSIS — J441 Chronic obstructive pulmonary disease with (acute) exacerbation: Secondary | ICD-10-CM | POA: Diagnosis not present

## 2017-03-26 DIAGNOSIS — M5442 Lumbago with sciatica, left side: Secondary | ICD-10-CM | POA: Diagnosis not present

## 2017-03-26 DIAGNOSIS — G8929 Other chronic pain: Secondary | ICD-10-CM | POA: Diagnosis not present

## 2017-03-26 DIAGNOSIS — M1612 Unilateral primary osteoarthritis, left hip: Secondary | ICD-10-CM | POA: Diagnosis not present

## 2017-03-26 DIAGNOSIS — M25552 Pain in left hip: Secondary | ICD-10-CM | POA: Diagnosis not present

## 2017-05-07 DIAGNOSIS — J42 Unspecified chronic bronchitis: Secondary | ICD-10-CM | POA: Diagnosis not present

## 2017-05-07 DIAGNOSIS — J209 Acute bronchitis, unspecified: Secondary | ICD-10-CM | POA: Diagnosis not present

## 2017-05-07 DIAGNOSIS — F0280 Dementia in other diseases classified elsewhere without behavioral disturbance: Secondary | ICD-10-CM | POA: Diagnosis not present

## 2017-05-07 DIAGNOSIS — G301 Alzheimer's disease with late onset: Secondary | ICD-10-CM | POA: Diagnosis not present

## 2017-05-20 DIAGNOSIS — K2990 Gastroduodenitis, unspecified, without bleeding: Secondary | ICD-10-CM | POA: Diagnosis not present

## 2017-05-20 DIAGNOSIS — K222 Esophageal obstruction: Secondary | ICD-10-CM | POA: Diagnosis not present

## 2017-05-20 DIAGNOSIS — R1310 Dysphagia, unspecified: Secondary | ICD-10-CM | POA: Diagnosis not present

## 2017-05-20 DIAGNOSIS — K219 Gastro-esophageal reflux disease without esophagitis: Secondary | ICD-10-CM | POA: Diagnosis not present

## 2017-05-30 DIAGNOSIS — L570 Actinic keratosis: Secondary | ICD-10-CM | POA: Diagnosis not present

## 2017-05-30 DIAGNOSIS — D485 Neoplasm of uncertain behavior of skin: Secondary | ICD-10-CM | POA: Diagnosis not present

## 2017-05-30 DIAGNOSIS — L821 Other seborrheic keratosis: Secondary | ICD-10-CM | POA: Diagnosis not present

## 2017-05-30 DIAGNOSIS — X32XXXA Exposure to sunlight, initial encounter: Secondary | ICD-10-CM | POA: Diagnosis not present

## 2017-05-30 DIAGNOSIS — D0461 Carcinoma in situ of skin of right upper limb, including shoulder: Secondary | ICD-10-CM | POA: Diagnosis not present

## 2017-06-12 DIAGNOSIS — S50361A Insect bite (nonvenomous) of right elbow, initial encounter: Secondary | ICD-10-CM | POA: Diagnosis not present

## 2017-06-12 DIAGNOSIS — S50862A Insect bite (nonvenomous) of left forearm, initial encounter: Secondary | ICD-10-CM | POA: Diagnosis not present

## 2017-06-12 DIAGNOSIS — D0461 Carcinoma in situ of skin of right upper limb, including shoulder: Secondary | ICD-10-CM | POA: Diagnosis not present

## 2017-06-13 ENCOUNTER — Institutional Professional Consult (permissible substitution): Payer: PPO | Admitting: Pulmonary Disease

## 2017-06-24 DIAGNOSIS — Z125 Encounter for screening for malignant neoplasm of prostate: Secondary | ICD-10-CM | POA: Diagnosis not present

## 2017-06-24 DIAGNOSIS — Z0000 Encounter for general adult medical examination without abnormal findings: Secondary | ICD-10-CM | POA: Diagnosis not present

## 2017-06-24 DIAGNOSIS — N529 Male erectile dysfunction, unspecified: Secondary | ICD-10-CM | POA: Diagnosis not present

## 2017-06-24 DIAGNOSIS — J449 Chronic obstructive pulmonary disease, unspecified: Secondary | ICD-10-CM | POA: Diagnosis not present

## 2017-06-24 DIAGNOSIS — I6522 Occlusion and stenosis of left carotid artery: Secondary | ICD-10-CM | POA: Diagnosis not present

## 2017-06-24 DIAGNOSIS — Z23 Encounter for immunization: Secondary | ICD-10-CM | POA: Diagnosis not present

## 2017-06-24 DIAGNOSIS — E7800 Pure hypercholesterolemia, unspecified: Secondary | ICD-10-CM | POA: Diagnosis not present

## 2017-07-02 ENCOUNTER — Other Ambulatory Visit (INDEPENDENT_AMBULATORY_CARE_PROVIDER_SITE_OTHER): Payer: Self-pay | Admitting: Vascular Surgery

## 2017-07-02 DIAGNOSIS — I779 Disorder of arteries and arterioles, unspecified: Secondary | ICD-10-CM

## 2017-07-02 DIAGNOSIS — I739 Peripheral vascular disease, unspecified: Principal | ICD-10-CM

## 2017-07-05 ENCOUNTER — Encounter (INDEPENDENT_AMBULATORY_CARE_PROVIDER_SITE_OTHER): Payer: PPO

## 2017-07-05 ENCOUNTER — Ambulatory Visit (INDEPENDENT_AMBULATORY_CARE_PROVIDER_SITE_OTHER): Payer: Self-pay | Admitting: Vascular Surgery

## 2017-09-18 DIAGNOSIS — E7800 Pure hypercholesterolemia, unspecified: Secondary | ICD-10-CM | POA: Diagnosis not present

## 2017-09-18 DIAGNOSIS — Z125 Encounter for screening for malignant neoplasm of prostate: Secondary | ICD-10-CM | POA: Diagnosis not present

## 2017-09-18 DIAGNOSIS — I6522 Occlusion and stenosis of left carotid artery: Secondary | ICD-10-CM | POA: Diagnosis not present

## 2017-09-18 DIAGNOSIS — N529 Male erectile dysfunction, unspecified: Secondary | ICD-10-CM | POA: Diagnosis not present

## 2017-09-18 DIAGNOSIS — J449 Chronic obstructive pulmonary disease, unspecified: Secondary | ICD-10-CM | POA: Diagnosis not present

## 2017-09-25 DIAGNOSIS — Z0000 Encounter for general adult medical examination without abnormal findings: Secondary | ICD-10-CM | POA: Diagnosis not present

## 2017-09-25 DIAGNOSIS — Z23 Encounter for immunization: Secondary | ICD-10-CM | POA: Diagnosis not present

## 2017-09-25 DIAGNOSIS — M7121 Synovial cyst of popliteal space [Baker], right knee: Secondary | ICD-10-CM | POA: Insufficient documentation

## 2017-09-25 DIAGNOSIS — N529 Male erectile dysfunction, unspecified: Secondary | ICD-10-CM | POA: Diagnosis not present

## 2017-10-15 DIAGNOSIS — X32XXXA Exposure to sunlight, initial encounter: Secondary | ICD-10-CM | POA: Diagnosis not present

## 2017-10-15 DIAGNOSIS — Z85828 Personal history of other malignant neoplasm of skin: Secondary | ICD-10-CM | POA: Diagnosis not present

## 2017-10-15 DIAGNOSIS — D485 Neoplasm of uncertain behavior of skin: Secondary | ICD-10-CM | POA: Diagnosis not present

## 2017-10-15 DIAGNOSIS — L570 Actinic keratosis: Secondary | ICD-10-CM | POA: Diagnosis not present

## 2017-10-15 DIAGNOSIS — L0292 Furuncle, unspecified: Secondary | ICD-10-CM | POA: Diagnosis not present

## 2017-10-15 DIAGNOSIS — L989 Disorder of the skin and subcutaneous tissue, unspecified: Secondary | ICD-10-CM | POA: Diagnosis not present

## 2017-10-15 DIAGNOSIS — Z08 Encounter for follow-up examination after completed treatment for malignant neoplasm: Secondary | ICD-10-CM | POA: Diagnosis not present

## 2017-10-15 DIAGNOSIS — C44629 Squamous cell carcinoma of skin of left upper limb, including shoulder: Secondary | ICD-10-CM | POA: Diagnosis not present

## 2017-10-15 DIAGNOSIS — L821 Other seborrheic keratosis: Secondary | ICD-10-CM | POA: Diagnosis not present

## 2017-10-15 DIAGNOSIS — L905 Scar conditions and fibrosis of skin: Secondary | ICD-10-CM | POA: Diagnosis not present

## 2017-11-04 DIAGNOSIS — L905 Scar conditions and fibrosis of skin: Secondary | ICD-10-CM | POA: Diagnosis not present

## 2017-11-04 DIAGNOSIS — C44629 Squamous cell carcinoma of skin of left upper limb, including shoulder: Secondary | ICD-10-CM | POA: Diagnosis not present

## 2017-11-08 DIAGNOSIS — H2513 Age-related nuclear cataract, bilateral: Secondary | ICD-10-CM | POA: Diagnosis not present

## 2017-12-05 ENCOUNTER — Encounter: Payer: Self-pay | Admitting: Emergency Medicine

## 2017-12-05 ENCOUNTER — Emergency Department
Admission: EM | Admit: 2017-12-05 | Discharge: 2017-12-05 | Disposition: A | Discharge status: Home / Self Care | Payer: PPO | Attending: Emergency Medicine | Admitting: Emergency Medicine

## 2017-12-05 ENCOUNTER — Emergency Department: Payer: PPO

## 2017-12-05 DIAGNOSIS — R42 Dizziness and giddiness: Secondary | ICD-10-CM

## 2017-12-05 DIAGNOSIS — Z79899 Other long term (current) drug therapy: Secondary | ICD-10-CM | POA: Insufficient documentation

## 2017-12-05 DIAGNOSIS — E860 Dehydration: Secondary | ICD-10-CM | POA: Diagnosis not present

## 2017-12-05 DIAGNOSIS — R55 Syncope and collapse: Secondary | ICD-10-CM | POA: Diagnosis not present

## 2017-12-05 DIAGNOSIS — F17210 Nicotine dependence, cigarettes, uncomplicated: Secondary | ICD-10-CM | POA: Insufficient documentation

## 2017-12-05 DIAGNOSIS — J449 Chronic obstructive pulmonary disease, unspecified: Secondary | ICD-10-CM | POA: Diagnosis not present

## 2017-12-05 DIAGNOSIS — R51 Headache: Secondary | ICD-10-CM | POA: Diagnosis not present

## 2017-12-05 DIAGNOSIS — R0981 Nasal congestion: Secondary | ICD-10-CM | POA: Diagnosis not present

## 2017-12-05 LAB — CBC
HCT: 46.9 % (ref 40.0–52.0)
Hemoglobin: 15.4 g/dL (ref 13.0–18.0)
MCH: 29.5 pg (ref 26.0–34.0)
MCHC: 33 g/dL (ref 32.0–36.0)
MCV: 89.6 fL (ref 80.0–100.0)
PLATELETS: 250 10*3/uL (ref 150–440)
RBC: 5.23 MIL/uL (ref 4.40–5.90)
RDW: 13.2 % (ref 11.5–14.5)
WBC: 6.8 10*3/uL (ref 3.8–10.6)

## 2017-12-05 LAB — URINALYSIS, COMPLETE (UACMP) WITH MICROSCOPIC
Bacteria, UA: NONE SEEN
Bilirubin Urine: NEGATIVE
Glucose, UA: NEGATIVE mg/dL
HGB URINE DIPSTICK: NEGATIVE
Ketones, ur: 5 mg/dL — AB
Leukocytes, UA: NEGATIVE
Nitrite: NEGATIVE
Protein, ur: NEGATIVE mg/dL
SPECIFIC GRAVITY, URINE: 1.02 (ref 1.005–1.030)
Squamous Epithelial / LPF: NONE SEEN
pH: 5 (ref 5.0–8.0)

## 2017-12-05 LAB — BASIC METABOLIC PANEL
Anion gap: 5 (ref 5–15)
BUN: 23 mg/dL — ABNORMAL HIGH (ref 6–20)
CALCIUM: 9.3 mg/dL (ref 8.9–10.3)
CO2: 26 mmol/L (ref 22–32)
Chloride: 110 mmol/L (ref 101–111)
Creatinine, Ser: 1.28 mg/dL — ABNORMAL HIGH (ref 0.61–1.24)
GFR calc Af Amer: >60 mL/min (ref >60)
GFR calc non Af Amer: 53 mL/min — ABNORMAL LOW (ref >60)
GLUCOSE: 111 mg/dL — AB (ref 65–99)
Potassium: 4.6 mmol/L (ref 3.5–5.1)
Sodium: 141 mmol/L (ref 135–145)

## 2017-12-05 MED ORDER — MECLIZINE HCL 25 MG PO TABS
25.0000 mg | ORAL_TABLET | Freq: Once | ORAL | Status: AC
  Administered 2017-12-05: 25 mg via ORAL
  Filled 2017-12-05: qty 1

## 2017-12-05 MED ORDER — MECLIZINE HCL 25 MG PO TABS: 25.0000 mg | ORAL_TABLET | Freq: Three times a day (TID) | ORAL | 0 refills | Status: DC | PRN

## 2017-12-05 MED ORDER — OXYMETAZOLINE HCL 0.05 % NA SOLN: 2.0000 | Freq: Two times a day (BID) | NASAL | 2 refills | Status: AC

## 2017-12-05 MED ORDER — SODIUM CHLORIDE 0.9 % IV BOLUS (SEPSIS)
1000.0000 mL | Freq: Once | INTRAVENOUS | Status: AC
  Administered 2017-12-05: 1000 mL via INTRAVENOUS

## 2017-12-05 NOTE — ED Notes (Signed)
Pt presentation discussed with Dr. Quentin Cornwall; see new order.

## 2017-12-05 NOTE — Discharge Instructions (Signed)
Follow up with your doctor in 2-3 days. Use afrin three times a day for nasal congestion. Take meclizine as prescribed for vertigo. Return to the ER for severe headache, chest pain, facial, droop, slurred speech, unilateral weakness or numbness or difficulty findings words or walking.

## 2017-12-05 NOTE — ED Provider Notes (Signed)
[facility] Emergency Department Provider Note  ____________________________________________  Time seen: Approximately 3:34 PM  I have reviewed the triage vital signs and the nursing notes.   HISTORY  Chief Complaint Near Syncope   HPI WINSLOW EDERER is a 77 y.o. male with a history of Alzheimer's, hyperlipidemia, COPD, smoking, and vertigowho presents for evaluation of dizziness. Patient reports that he was in his usual state of health until around 12 PM today. He bent over to pick up a bucket from the ground and developed sudden onset of dizziness. He reports that he felt the sensation of room spinning but also felt like he was going to pass out. He became diaphoretic and nauseous and developed a HA. He describes the HA as pressure, 4/10 generalized which has now resolved. After sitting for about 30 min his symptoms improved. He reports that he is still feeling slightly off balance now. He reports problems with his sinuses and has been feeling congested for the last few days. He reports having vertigo in the past which usually presents sudden like this episode but resolves when he stops moving. He denies any personal or family history of stroke, facial droop, slurred speech, unilateral weakness or numbness, changes in vision, difficulty finding words. No chest pain, palpitations, shortness of breath. Patient reports that he has not eaten today. No recent illness.  Past Medical History:  Diagnosis Date  . Arthritis   . Bronchitis   . COPD (chronic obstructive pulmonary disease) ([place])   . Dysphagia   . Elevated cholesterol   . Erectile dysfunction   . GERD (gastroesophageal reflux disease)   . Schatzki's ring   . SDAT (senile dementia of Alzheimer's type)   . Tendinitis   . Weight loss     There are no active problems to display for this patient.   Past Surgical History:  Procedure Laterality Date  . COLONOSCOPY WITH ESOPHAGOGASTRODUODENOSCOPY (EGD)  AND ESOPHAGEAL DILATION (ED)    . ESOPHAGOGASTRODUODENOSCOPY (EGD) WITH PROPOFOL N/A 10/26/2016   Procedure: ESOPHAGOGASTRODUODENOSCOPY (EGD) WITH PROPOFOL;  Surgeon: Manya Silvas, MD;  Location: [facility] ENDOSCOPY;  Service: Endoscopy;  Laterality: N/A;  . HERNIA REPAIR    . SAVORY DILATION N/A 10/26/2016   Procedure: SAVORY DILATION;  Surgeon: Manya Silvas, MD;  Location: [facility] ENDOSCOPY;  Service: Endoscopy;  Laterality: N/A;    Prior to Admission medications   Medication Sig Start Date End Date Taking? Authorizing Provider  meclizine (ANTIVERT) 25 MG tablet Take 1 tablet (25 mg total) by mouth 3 (three) times daily as needed for dizziness. 12/05/17   Rudene Re, MD  memantine [facility]) 5 MG tablet Take 1 tablet by mouth 2 (two) times daily. 11/28/17   [provider]  Multiple Vitamin (MULTIVITAMIN WITH MINERALS) TABS tablet Take 1 tablet by mouth daily. Centrum.    [provider]  naproxen sodium (ANAPROX) 220 MG tablet Take 220-440 mg by mouth 2 (two) times daily as needed (for pain.).    [provider]  omeprazole (PRILOSEC) 40 MG capsule Take 1 capsule by mouth daily. 10/25/17   [provider]  oxymetazoline (AFRIN) 0.05 % nasal spray Place 2 sprays into both nostrils 2 (two) times daily for 10 days. 12/05/17 12/15/17  Rudene Re, MD  PROAIR HFA 108 [phone] Base) MCG/ACT inhaler Inhale 1-2 puffs into the lungs every 6 (six) hours as needed. For shortness of breath 12/03/16   [provider]    Allergies Aspirin  No family history on file.  Social History Social History   Tobacco Use  . Smoking status: Current Every Day Smoker    Packs/day: 0.50  . Smokeless tobacco: Never Used  Substance Use Topics  . Alcohol use: Yes    Alcohol/week: 1.8 oz    Types: 3 Cans of beer per week  . Drug use: No    Review of Systems  Constitutional: Negative for fever. Eyes: Negative for visual changes. ENT: Negative for sore throat.  + sinus congestion Neck: No neck pain  Cardiovascular: Negative for chest pain. Respiratory: Negative for shortness of breath. Gastrointestinal: Negative for abdominal pain, vomiting or diarrhea. Genitourinary: Negative for dysuria. Musculoskeletal: Negative for back pain. Skin: Negative for rash. Neurological: Negative for weakness or numbness. + HA and dizziness Psych: No SI or HI  ____________________________________________   PHYSICAL EXAM:  VITAL SIGNS: ED Triage Vitals  Enc Vitals Group     BP 12/05/17 1417 134/73     Pulse Rate 12/05/17 1417 65     Resp 12/05/17 1417 16     Temp 12/05/17 1417 97.9 F (36.6 C)     Temp Source 12/05/17 1417 Oral     SpO2 12/05/17 1417 96 %     Weight 12/05/17 1418 155 lb (70.3 kg)     Height 12/05/17 1418 6' (1.829 m)     Head Circumference --      Peak Flow --      Pain Score 12/05/17 1418 4     Pain Loc --      Pain Edu? --      Excl. in [place]? --     Constitutional: Alert and oriented. Well appearing and in no apparent distress. HEENT:      Head: Normocephalic and atraumatic.         Eyes: Conjunctivae are normal. Sclera is non-icteric.       Mouth/Throat: Mucous membranes are moist.       Neck: Supple with no signs of meningismus. Cardiovascular: Regular rate and rhythm. No murmurs, gallops, or rubs. 2+ symmetrical distal pulses are present in all extremities. No JVD. Respiratory: Normal respiratory effort. Lungs are clear to auscultation bilaterally. No wheezes, crackles, or rhonchi.  Gastrointestinal: Soft, non tender, and non distended with positive bowel sounds. No rebound or guarding. Musculoskeletal: Nontender with normal range of motion in all extremities. No edema, cyanosis, or erythema of extremities. Neurologic: Normal speech and language. A & O x3, PERRL, EOMI, 2-3 beats of horizontal fatiguable unidirectional nystagmus, CN II-XII intact, motor testing reveals good tone and bulk throughout. There is no evidence of  pronator drift or dysmetria. Muscle strength is 5/5 throughout. Sensory examination is intact. Gait is normal. Skin: Skin is warm, dry and intact. No rash noted. Psychiatric: Mood and affect are normal. Speech and behavior are normal.  ____________________________________________   LABS (all labs ordered are listed, but only abnormal results are displayed)  Labs Reviewed  BASIC METABOLIC PANEL - Abnormal; Notable for the following components:      Result Value   Glucose, Bld 111 (*)    BUN 23 (*)    Creatinine, Ser 1.28 (*)    GFR calc non Af Amer 53 (*)    All other components within normal limits  URINALYSIS, COMPLETE (UACMP) WITH MICROSCOPIC - Abnormal; Notable for the following components:   Color, Urine YELLOW (*)    APPearance HAZY (*)    Ketones, ur 5 (*)    All other components within normal limits  CBC  CBG MONITORING,  ED   ____________________________________________  EKG  ED ECG REPORT I, Rudene Re, the attending physician, personally viewed and interpreted this ECG.  Normal sinus rhythm, no intervals, normal axis, no ST elevations or depressions. Normal EKG.  ____________________________________________  RADIOLOGY  Interpreted by me: CT head: No acute intracranial findigns   Interpretation by Radiologist:  Ct Head Wo Contrast  Result Date: 12/05/2017 CLINICAL DATA:  Severe headaches. EXAM: CT HEAD WITHOUT CONTRAST TECHNIQUE: Contiguous axial images were obtained from the base of the skull through the vertex without intravenous contrast. COMPARISON:  None. FINDINGS: Brain: No evidence of acute infarction, hemorrhage, hydrocephalus, extra-axial collection or mass lesion/mass effect. There is mild diffuse atrophy. Vascular: No hyperdense vessel or unexpected calcification. Skull: Normal. Negative for fracture or focal lesion. Sinuses/Orbits: Mucoperiosteal thickening of bilateral ethmoid, bilateral frontal sinuses are identified. The orbits are normal.  Other: None. IMPRESSION: No focal acute intracranial abnormality identified. Mucoperiosteal thickening of bilateral ethmoid and bilateral frontal sinuses are identified. Sinusitis is not excluded. Electronically Signed   By: Abelardo Diesel M.D.   On: 12/05/2017 15:23     ____________________________________________   PROCEDURES  Procedure(s) performed: None Procedures Critical Care performed:  None ____________________________________________   INITIAL IMPRESSION / ASSESSMENT AND PLAN / ED COURSE   77 y.o. male with a history of Alzheimer's, hyperlipidemia, COPD, smoking, and vertigowho presents for evaluation of dizziness. Patient describes this episode as room spinning but also is feeling like he was going to pass out. He is completely neurologically intact with unidirection horizontal fatigable nystagmus. Gait is normal, no dysmetria on FNF and shin-heel. EKG with no evidence of ischemia or dysrhythmia. Head CT showing sinusitis but no other acute findings. Very low suspicion for central cause at this time. Labs show slight dehydration. We'll give IV fluids and meclizine and reassess. Most likely vertigo exacerbated by slight dehydration and sinus congestion  Clinical Course as of Dec 05 1728  Thu Dec 05, 2017  1725 Patient reports full resolution of the symptoms after meclizine and fluids. He is ambulating without any dizziness or difficulty. His neurological exam remained intact. At this time patient is stable for discharge home. Recommended increase oral hydration. Patient was provided with a prescription for meclizine. Recommended close follow-up with primary care doctor. Discussed return precautions with patient and his wife for any signs of stroke, severe headache, or chest pain. Will provide a prescription for afrin for sinusitis.  [CV]    Clinical Course User Index [CV] Alfred Levins [place], MD     As part of my medical decision making, I reviewed the following data within the  [facility] notes reviewed and incorporated, Labs reviewed , EKG interpreted , Old chart reviewed, Radiograph reviewed , Notes from prior ED visits and  Controlled Substance Database    Pertinent labs & imaging results that were available during my care of the patient were reviewed by me and considered in my medical decision making (see chart for details).    ____________________________________________   FINAL CLINICAL IMPRESSION(S) / ED DIAGNOSES  Final diagnoses:  Dehydration  Vertigo  Sinus congestion      NEW MEDICATIONS STARTED DURING THIS VISIT:  ED Discharge Orders        Ordered    oxymetazoline (AFRIN) 0.05 % nasal spray  2 times daily     12/05/17 1728    meclizine (ANTIVERT) 25 MG tablet  3 times daily PRN     12/05/17 1728       Note:  This  document was prepared using Systems analyst and may include unintentional dictation errors.    Alfred Levins, [place], MD 12/05/17 [phone]

## 2017-12-05 NOTE — ED Triage Notes (Signed)
Pt in via POV with complaints of near syncopal episode, getting very dizzy, diaphoretic, headache.  Pt reports incident occurred at 1200.  Pt reports hx of same, getting dizzy with any sudden movements, reports hx of vertigo as well.  NAD noted at this time.

## 2017-12-05 NOTE — ED Notes (Signed)
Informed RN that patient has been roomed and is ready for evaluation.  Patient in NAD at this time and call bell placed within reach.   

## 2017-12-10 DIAGNOSIS — N529 Male erectile dysfunction, unspecified: Secondary | ICD-10-CM | POA: Diagnosis not present

## 2017-12-10 DIAGNOSIS — I6522 Occlusion and stenosis of left carotid artery: Secondary | ICD-10-CM | POA: Diagnosis not present

## 2017-12-10 DIAGNOSIS — Z23 Encounter for immunization: Secondary | ICD-10-CM | POA: Diagnosis not present

## 2017-12-10 DIAGNOSIS — R42 Dizziness and giddiness: Secondary | ICD-10-CM | POA: Diagnosis not present

## 2017-12-10 DIAGNOSIS — M778 Other enthesopathies, not elsewhere classified: Secondary | ICD-10-CM | POA: Diagnosis not present

## 2017-12-30 DIAGNOSIS — M778 Other enthesopathies, not elsewhere classified: Secondary | ICD-10-CM | POA: Diagnosis not present

## 2017-12-30 DIAGNOSIS — J449 Chronic obstructive pulmonary disease, unspecified: Secondary | ICD-10-CM | POA: Diagnosis not present

## 2017-12-30 DIAGNOSIS — J40 Bronchitis, not specified as acute or chronic: Secondary | ICD-10-CM | POA: Diagnosis not present

## 2018-02-18 DIAGNOSIS — C44311 Basal cell carcinoma of skin of nose: Secondary | ICD-10-CM | POA: Diagnosis not present

## 2018-02-18 DIAGNOSIS — D0439 Carcinoma in situ of skin of other parts of face: Secondary | ICD-10-CM | POA: Diagnosis not present

## 2018-02-18 DIAGNOSIS — L821 Other seborrheic keratosis: Secondary | ICD-10-CM | POA: Diagnosis not present

## 2018-02-18 DIAGNOSIS — Z08 Encounter for follow-up examination after completed treatment for malignant neoplasm: Secondary | ICD-10-CM | POA: Diagnosis not present

## 2018-02-18 DIAGNOSIS — D485 Neoplasm of uncertain behavior of skin: Secondary | ICD-10-CM | POA: Diagnosis not present

## 2018-02-18 DIAGNOSIS — Z85828 Personal history of other malignant neoplasm of skin: Secondary | ICD-10-CM | POA: Diagnosis not present

## 2018-02-18 DIAGNOSIS — L739 Follicular disorder, unspecified: Secondary | ICD-10-CM | POA: Diagnosis not present

## 2018-02-18 DIAGNOSIS — X32XXXA Exposure to sunlight, initial encounter: Secondary | ICD-10-CM | POA: Diagnosis not present

## 2018-02-18 DIAGNOSIS — L570 Actinic keratosis: Secondary | ICD-10-CM | POA: Diagnosis not present

## 2018-02-25 DIAGNOSIS — D485 Neoplasm of uncertain behavior of skin: Secondary | ICD-10-CM | POA: Diagnosis not present

## 2018-03-19 DIAGNOSIS — C44329 Squamous cell carcinoma of skin of other parts of face: Secondary | ICD-10-CM | POA: Diagnosis not present

## 2018-03-19 DIAGNOSIS — C44311 Basal cell carcinoma of skin of nose: Secondary | ICD-10-CM | POA: Diagnosis not present

## 2018-03-19 DIAGNOSIS — Z85828 Personal history of other malignant neoplasm of skin: Secondary | ICD-10-CM | POA: Diagnosis not present

## 2018-03-27 DIAGNOSIS — Z85828 Personal history of other malignant neoplasm of skin: Secondary | ICD-10-CM | POA: Diagnosis not present

## 2018-03-27 DIAGNOSIS — C44629 Squamous cell carcinoma of skin of left upper limb, including shoulder: Secondary | ICD-10-CM | POA: Diagnosis not present

## 2018-05-06 DIAGNOSIS — M1811 Unilateral primary osteoarthritis of first carpometacarpal joint, right hand: Secondary | ICD-10-CM | POA: Diagnosis not present

## 2018-07-24 DIAGNOSIS — G5602 Carpal tunnel syndrome, left upper limb: Secondary | ICD-10-CM | POA: Diagnosis not present

## 2018-07-24 DIAGNOSIS — G5621 Lesion of ulnar nerve, right upper limb: Secondary | ICD-10-CM | POA: Diagnosis not present

## 2018-07-24 DIAGNOSIS — G5601 Carpal tunnel syndrome, right upper limb: Secondary | ICD-10-CM | POA: Diagnosis not present

## 2018-07-24 DIAGNOSIS — G5622 Lesion of ulnar nerve, left upper limb: Secondary | ICD-10-CM | POA: Diagnosis not present

## 2018-07-28 DIAGNOSIS — Z08 Encounter for follow-up examination after completed treatment for malignant neoplasm: Secondary | ICD-10-CM | POA: Diagnosis not present

## 2018-07-28 DIAGNOSIS — X32XXXA Exposure to sunlight, initial encounter: Secondary | ICD-10-CM | POA: Diagnosis not present

## 2018-07-28 DIAGNOSIS — L821 Other seborrheic keratosis: Secondary | ICD-10-CM | POA: Diagnosis not present

## 2018-07-28 DIAGNOSIS — L570 Actinic keratosis: Secondary | ICD-10-CM | POA: Diagnosis not present

## 2018-07-28 DIAGNOSIS — Z85828 Personal history of other malignant neoplasm of skin: Secondary | ICD-10-CM | POA: Diagnosis not present

## 2018-07-28 DIAGNOSIS — D485 Neoplasm of uncertain behavior of skin: Secondary | ICD-10-CM | POA: Diagnosis not present

## 2018-07-31 DIAGNOSIS — G5603 Carpal tunnel syndrome, bilateral upper limbs: Secondary | ICD-10-CM | POA: Diagnosis not present

## 2018-08-03 DIAGNOSIS — B9689 Other specified bacterial agents as the cause of diseases classified elsewhere: Secondary | ICD-10-CM | POA: Diagnosis not present

## 2018-08-03 DIAGNOSIS — J019 Acute sinusitis, unspecified: Secondary | ICD-10-CM | POA: Diagnosis not present

## 2018-08-03 DIAGNOSIS — J209 Acute bronchitis, unspecified: Secondary | ICD-10-CM | POA: Diagnosis not present

## 2018-08-03 DIAGNOSIS — R300 Dysuria: Secondary | ICD-10-CM | POA: Diagnosis not present

## 2018-08-03 DIAGNOSIS — N410 Acute prostatitis: Secondary | ICD-10-CM | POA: Diagnosis not present

## 2018-08-05 DIAGNOSIS — L570 Actinic keratosis: Secondary | ICD-10-CM | POA: Diagnosis not present

## 2018-08-05 DIAGNOSIS — X32XXXA Exposure to sunlight, initial encounter: Secondary | ICD-10-CM | POA: Diagnosis not present

## 2018-08-07 DIAGNOSIS — F0280 Dementia in other diseases classified elsewhere without behavioral disturbance: Secondary | ICD-10-CM | POA: Diagnosis not present

## 2018-08-07 DIAGNOSIS — G301 Alzheimer's disease with late onset: Secondary | ICD-10-CM | POA: Diagnosis not present

## 2018-08-07 DIAGNOSIS — N401 Enlarged prostate with lower urinary tract symptoms: Secondary | ICD-10-CM | POA: Insufficient documentation

## 2018-08-07 DIAGNOSIS — R42 Dizziness and giddiness: Secondary | ICD-10-CM | POA: Diagnosis not present

## 2018-08-07 DIAGNOSIS — J441 Chronic obstructive pulmonary disease with (acute) exacerbation: Secondary | ICD-10-CM | POA: Diagnosis not present

## 2018-08-07 DIAGNOSIS — R3914 Feeling of incomplete bladder emptying: Secondary | ICD-10-CM | POA: Insufficient documentation

## 2018-09-06 DIAGNOSIS — M791 Myalgia, unspecified site: Secondary | ICD-10-CM | POA: Diagnosis not present

## 2018-09-06 DIAGNOSIS — R05 Cough: Secondary | ICD-10-CM | POA: Diagnosis not present

## 2018-12-08 DIAGNOSIS — D0422 Carcinoma in situ of skin of left ear and external auricular canal: Secondary | ICD-10-CM | POA: Diagnosis not present

## 2018-12-08 DIAGNOSIS — D485 Neoplasm of uncertain behavior of skin: Secondary | ICD-10-CM | POA: Diagnosis not present

## 2018-12-08 DIAGNOSIS — X32XXXA Exposure to sunlight, initial encounter: Secondary | ICD-10-CM | POA: Diagnosis not present

## 2018-12-08 DIAGNOSIS — Z85828 Personal history of other malignant neoplasm of skin: Secondary | ICD-10-CM | POA: Diagnosis not present

## 2018-12-08 DIAGNOSIS — Z08 Encounter for follow-up examination after completed treatment for malignant neoplasm: Secondary | ICD-10-CM | POA: Diagnosis not present

## 2018-12-08 DIAGNOSIS — D1801 Hemangioma of skin and subcutaneous tissue: Secondary | ICD-10-CM | POA: Diagnosis not present

## 2018-12-08 DIAGNOSIS — L570 Actinic keratosis: Secondary | ICD-10-CM | POA: Diagnosis not present

## 2018-12-12 ENCOUNTER — Emergency Department: Payer: PPO

## 2018-12-12 ENCOUNTER — Other Ambulatory Visit: Payer: Self-pay

## 2018-12-12 ENCOUNTER — Emergency Department
Admission: EM | Admit: 2018-12-12 | Discharge: 2018-12-12 | Disposition: A | Discharge status: Home / Self Care | Payer: PPO | Attending: Emergency Medicine | Admitting: Emergency Medicine

## 2018-12-12 DIAGNOSIS — F17210 Nicotine dependence, cigarettes, uncomplicated: Secondary | ICD-10-CM | POA: Diagnosis not present

## 2018-12-12 DIAGNOSIS — K222 Esophageal obstruction: Secondary | ICD-10-CM

## 2018-12-12 DIAGNOSIS — J439 Emphysema, unspecified: Secondary | ICD-10-CM | POA: Diagnosis not present

## 2018-12-12 DIAGNOSIS — Z79899 Other long term (current) drug therapy: Secondary | ICD-10-CM | POA: Insufficient documentation

## 2018-12-12 DIAGNOSIS — R1310 Dysphagia, unspecified: Secondary | ICD-10-CM | POA: Diagnosis present

## 2018-12-12 DIAGNOSIS — J449 Chronic obstructive pulmonary disease, unspecified: Secondary | ICD-10-CM | POA: Diagnosis not present

## 2018-12-12 DIAGNOSIS — R001 Bradycardia, unspecified: Secondary | ICD-10-CM | POA: Diagnosis not present

## 2018-12-12 LAB — CBC WITH DIFFERENTIAL/PLATELET
ABS IMMATURE GRANULOCYTES: 0.02 10*3/uL (ref 0.00–0.07)
BASOS ABS: 0 10*3/uL (ref 0.0–0.1)
BASOS PCT: 0 %
Eosinophils Absolute: 0.2 10*3/uL (ref 0.0–0.5)
Eosinophils Relative: 4 %
HCT: 43.5 % (ref 39.0–52.0)
Hemoglobin: 14.6 g/dL (ref 13.0–17.0)
Immature Granulocytes: 0 %
LYMPHS PCT: 24 %
Lymphs Abs: 1.2 10*3/uL (ref 0.7–4.0)
MCH: 29.8 pg (ref 26.0–34.0)
MCHC: 33.6 g/dL (ref 30.0–36.0)
MCV: 88.8 fL (ref 80.0–100.0)
Monocytes Absolute: 0.4 10*3/uL (ref 0.1–1.0)
Monocytes Relative: 7 %
NEUTROS ABS: 3.2 10*3/uL (ref 1.7–7.7)
NRBC: 0 % (ref 0.0–0.2)
Neutrophils Relative %: 65 %
PLATELETS: 260 10*3/uL (ref 150–400)
RBC: 4.9 MIL/uL (ref 4.22–5.81)
RDW: 13.4 % (ref 11.5–15.5)
WBC: 5 10*3/uL (ref 4.0–10.5)

## 2018-12-12 LAB — BASIC METABOLIC PANEL
Anion gap: 6 (ref 5–15)
BUN: 18 mg/dL (ref 8–23)
CO2: 25 mmol/L (ref 22–32)
CREATININE: 1.03 mg/dL (ref 0.61–1.24)
Calcium: 9.1 mg/dL (ref 8.9–10.3)
Chloride: 108 mmol/L (ref 98–111)
Glucose, Bld: 100 mg/dL — ABNORMAL HIGH (ref 70–99)
Potassium: 4 mmol/L (ref 3.5–5.1)
SODIUM: 139 mmol/L (ref 135–145)

## 2018-12-12 LAB — TROPONIN I: Troponin I: <0.03 ng/mL (ref <0.03)

## 2018-12-12 NOTE — ED Notes (Signed)
Difficulty swallowing that began yesterday.

## 2018-12-12 NOTE — ED Notes (Addendum)
Pt given coke and warm water to attempt drinking po fluids.

## 2018-12-12 NOTE — ED Notes (Signed)
Patient transported to X-ray 

## 2018-12-12 NOTE — ED Provider Notes (Signed)
[facility] Emergency Department Provider Note  ____________________________________________   First MD Initiated Contact with Patient 12/12/18 [phone]     (approximate)  I have reviewed the triage vital signs and the nursing notes.   HISTORY  Chief Complaint Dysphagia   HPI James [name] is a 78 y.o. male with a history of COPD as well as GERD and a Schatzki's ring with multiple esophageal dilatations was presented emergency department today with an inability to swallow this morning.  He also states that episode of chest burning that lasted several minutes on the way to the hospital and this is since resolved.  Says that he does have burning when he has needed dilation in the past.  Says that his last dilation was 2 or 3 years ago and that he has needed a dilation every several years.  Sees Dr. Vira Agar of gastroenterology.  Takes a Prilosec daily.  States that he thinks that the issue has "eased up" at this time.   Past Medical History:  Diagnosis Date  . Arthritis   . Bronchitis   . COPD (chronic obstructive pulmonary disease) ([place])   . Dysphagia   . Elevated cholesterol   . Erectile dysfunction   . GERD (gastroesophageal reflux disease)   . Schatzki's ring   . SDAT (senile dementia of Alzheimer's type) ([place])   . Tendinitis   . Weight loss     There are no active problems to display for this patient.   Past Surgical History:  Procedure Laterality Date  . COLONOSCOPY WITH ESOPHAGOGASTRODUODENOSCOPY (EGD) AND ESOPHAGEAL DILATION (ED)    . ESOPHAGOGASTRODUODENOSCOPY (EGD) WITH PROPOFOL N/A 10/26/2016   Procedure: ESOPHAGOGASTRODUODENOSCOPY (EGD) WITH PROPOFOL;  Surgeon: Manya Silvas, MD;  Location: [facility] ENDOSCOPY;  Service: Endoscopy;  Laterality: N/A;  . HERNIA REPAIR    . SAVORY DILATION N/A 10/26/2016   Procedure: SAVORY DILATION;  Surgeon: Manya Silvas, MD;  Location: [facility] ENDOSCOPY;  Service: Endoscopy;  Laterality: N/A;    Prior to  Admission medications   Medication Sig Start Date End Date Taking? Authorizing Provider  doxazosin (CARDURA) 2 MG tablet Take 2 mg by mouth Nightly. 08/07/18 08/07/19 Yes [provider]  sildenafil (REVATIO) 20 MG tablet Take 20 mg by mouth as directed. 06/19/18  Yes [provider]  traMADol (ULTRAM) 50 MG tablet Take 50 mg by mouth every 8 (eight) hours as needed for pain. 03/26/17  Yes [provider]  BREO ELLIPTA 100-25 MCG/INH AEPB Inhale 1 puff into the lungs daily. 11/19/18   [provider]  meclizine (ANTIVERT) 25 MG tablet Take 1 tablet (25 mg total) by mouth 3 (three) times daily as needed for dizziness. 12/05/17   Rudene Re, MD  memantine [facility]) 5 MG tablet Take 1 tablet by mouth 2 (two) times daily. 11/28/17   [provider]  Multiple Minerals (MULTI-MINERALS PO) Take 1 tablet by mouth daily.    [provider]  Multiple Vitamin (MULTIVITAMIN WITH MINERALS) TABS tablet Take 1 tablet by mouth daily. Centrum.    [provider]  naproxen sodium (ANAPROX) 220 MG tablet Take 220-440 mg by mouth 2 (two) times daily as needed (for pain.).    [provider]  omeprazole (PRILOSEC) 40 MG capsule Take 1 capsule by mouth daily. 10/25/17   [provider]  PROAIR HFA 108 (90 Base) MCG/ACT inhaler Inhale 1-2 puffs into the lungs every 6 (six) hours as needed. For shortness of breath 12/03/16   [provider]  Allergies Aspirin  No family history on file.  Social History Social History   Tobacco Use  . Smoking status: Current Every Day Smoker    Packs/day: 0.50  . Smokeless tobacco: Never Used  Substance Use Topics  . Alcohol use: Yes    Alcohol/week: 3.0 standard drinks    Types: 3 Cans of beer per week  . Drug use: No    Review of Systems  Constitutional: No fever/chills Eyes: No visual changes. ENT: No sore throat. Cardiovascular: As above. Respiratory: Denies shortness of  breath. Gastrointestinal: No abdominal pain.  No nausea, no vomiting.  No diarrhea.  No constipation. Genitourinary: Negative for dysuria. Musculoskeletal: Negative for back pain. Skin: Negative for rash. Neurological: Negative for headaches, focal weakness or numbness.   ____________________________________________   PHYSICAL EXAM:  VITAL SIGNS: ED Triage Vitals [12/12/18 0940]  Enc Vitals Group     BP 128/78     Pulse Rate 66     Resp 18     Temp 98.2 F (36.8 C)     Temp Source Oral     SpO2 95 %     Weight 155 lb (70.3 kg)     Height 5\' 11"  (1.803 m)     Head Circumference      Peak Flow      Pain Score 0     Pain Loc      Pain Edu?      Excl. in [place]?     Constitutional: Alert and oriented. Well appearing and in no acute distress.  Controlling secretions.  Speaking in a normal voice. Eyes: Conjunctivae are normal.  Head: Atraumatic. Nose: No congestion/rhinnorhea. Mouth/Throat: Mucous membranes are moist.  Neck: No stridor.   Cardiovascular: Normal rate, regular rhythm. Grossly normal heart sounds.  Respiratory: Normal respiratory effort.  No retractions. Lungs CTAB. Gastrointestinal: Soft and nontender. No distention.  Musculoskeletal: No lower extremity tenderness nor edema.  No joint effusions. Neurologic:  Normal speech and language. No gross focal neurologic deficits are appreciated. Skin:  Skin is warm, dry and intact. No rash noted. Psychiatric: Mood and affect are normal. Speech and behavior are normal.  ____________________________________________   LABS (all labs ordered are listed, but only abnormal results are displayed)  Labs Reviewed  CBC WITH DIFFERENTIAL/PLATELET  BASIC METABOLIC PANEL  TROPONIN I   ____________________________________________  EKG  ED ECG REPORT I, Doran Stabler, the attending physician, personally viewed and interpreted this ECG.   Date: 12/12/2018  EKG Time: 1036  Rate: 58  Rhythm: sinus bradycardia  Axis:  Normal  Intervals:none  ST&T Change: No ST segment elevation or depression.  Single T wave inversion in aVL  ____________________________________________  RADIOLOGY  Chest x-ray with emphysema but without acute disease. ____________________________________________   PROCEDURES  Procedure(s) performed:   Procedures  Critical Care performed:   ____________________________________________   INITIAL IMPRESSION / ASSESSMENT AND PLAN / ED COURSE  Pertinent labs & imaging results that were available during my care of the patient were reviewed by me and considered in my medical decision making (see chart for details).  Differential diagnosis includes, but is not limited to, ACS, aortic dissection, pulmonary embolism, cardiac tamponade, pneumothorax, pneumonia, pericarditis, myocarditis, GI-related causes including esophagitis/gastritis, and musculoskeletal chest wall pain.   As part of my medical decision making, I reviewed the following data within the electronic MEDICAL RECORD NUMBER Notes from prior ED visits  ----------------------------------------- 11:28 AM on 12/12/2018 -----------------------------------------  Patient at this time has been able to tolerate sips  of [employer].  I discussed this with Dr. Alice Reichert of gastroenterology who says that there is no need for emergent endoscopy at this time and that the patient should be placed on a full liquid diet.  Dr. Alice Reichert says that he will for the patient's information on outpatient scheduling and he will receive a call from gastroenterologist afternoon to schedule endoscopy for next week.  Patient to return for any worsening or concerning symptoms.  Patient knows to have a full liquid diet ____________________________________________   FINAL CLINICAL IMPRESSION(S) / ED DIAGNOSES  Schatzki ring.  Esophageal stricture  NEW MEDICATIONS STARTED DURING THIS VISIT:  New Prescriptions   No medications on file     Note:  This document  was prepared using Dragon voice recognition software and may include unintentional dictation errors.     Orbie Pyo, MD 12/12/18 [phone]

## 2018-12-12 NOTE — ED Triage Notes (Signed)
Pt states he has a hx of esophageal stricture and stretching, states he began having issues yesterday and today not able to swallow his pills.

## 2018-12-16 ENCOUNTER — Encounter: Payer: Self-pay | Admitting: *Deleted

## 2018-12-16 DIAGNOSIS — K2990 Gastroduodenitis, unspecified, without bleeding: Secondary | ICD-10-CM | POA: Diagnosis not present

## 2018-12-16 DIAGNOSIS — K222 Esophageal obstruction: Secondary | ICD-10-CM | POA: Diagnosis not present

## 2018-12-16 DIAGNOSIS — R1310 Dysphagia, unspecified: Secondary | ICD-10-CM | POA: Diagnosis not present

## 2018-12-16 DIAGNOSIS — K210 Gastro-esophageal reflux disease with esophagitis: Secondary | ICD-10-CM | POA: Diagnosis not present

## 2018-12-17 ENCOUNTER — Ambulatory Visit
Admission: RE | Admit: 2018-12-17 | Discharge: 2018-12-17 | Disposition: A | Discharge status: Home / Self Care | Payer: PPO | Attending: Unknown Physician Specialty | Admitting: Unknown Physician Specialty

## 2018-12-17 ENCOUNTER — Encounter
Admission: RE | Disposition: A | Discharge status: Home / Self Care | Payer: Self-pay | Source: Home / Self Care | Attending: Unknown Physician Specialty

## 2018-12-17 ENCOUNTER — Ambulatory Visit: Payer: PPO | Admitting: Anesthesiology

## 2018-12-17 ENCOUNTER — Encounter: Payer: Self-pay | Admitting: *Deleted

## 2018-12-17 DIAGNOSIS — J449 Chronic obstructive pulmonary disease, unspecified: Secondary | ICD-10-CM | POA: Insufficient documentation

## 2018-12-17 DIAGNOSIS — F0280 Dementia in other diseases classified elsewhere without behavioral disturbance: Secondary | ICD-10-CM | POA: Insufficient documentation

## 2018-12-17 DIAGNOSIS — R1310 Dysphagia, unspecified: Secondary | ICD-10-CM | POA: Diagnosis not present

## 2018-12-17 DIAGNOSIS — K2970 Gastritis, unspecified, without bleeding: Secondary | ICD-10-CM | POA: Diagnosis not present

## 2018-12-17 DIAGNOSIS — K2950 Unspecified chronic gastritis without bleeding: Secondary | ICD-10-CM | POA: Diagnosis not present

## 2018-12-17 DIAGNOSIS — K222 Esophageal obstruction: Secondary | ICD-10-CM | POA: Diagnosis not present

## 2018-12-17 DIAGNOSIS — G301 Alzheimer's disease with late onset: Secondary | ICD-10-CM | POA: Diagnosis not present

## 2018-12-17 DIAGNOSIS — E7800 Pure hypercholesterolemia, unspecified: Secondary | ICD-10-CM | POA: Diagnosis not present

## 2018-12-17 DIAGNOSIS — Z79899 Other long term (current) drug therapy: Secondary | ICD-10-CM | POA: Diagnosis not present

## 2018-12-17 DIAGNOSIS — N400 Enlarged prostate without lower urinary tract symptoms: Secondary | ICD-10-CM | POA: Diagnosis not present

## 2018-12-17 DIAGNOSIS — F17210 Nicotine dependence, cigarettes, uncomplicated: Secondary | ICD-10-CM | POA: Diagnosis not present

## 2018-12-17 DIAGNOSIS — K449 Diaphragmatic hernia without obstruction or gangrene: Secondary | ICD-10-CM | POA: Diagnosis not present

## 2018-12-17 DIAGNOSIS — K219 Gastro-esophageal reflux disease without esophagitis: Secondary | ICD-10-CM | POA: Insufficient documentation

## 2018-12-17 HISTORY — DX: Occlusion and stenosis of left carotid artery: I65.22

## 2018-12-17 HISTORY — DX: Dizziness and giddiness: R42

## 2018-12-17 HISTORY — DX: Gastroduodenitis, unspecified, without bleeding: K29.90

## 2018-12-17 HISTORY — PX: BALLOON DILATION: SHX5330

## 2018-12-17 HISTORY — DX: Synovial cyst of popliteal space (Baker), right knee: M71.21

## 2018-12-17 HISTORY — DX: Carpal tunnel syndrome, right upper limb: G56.01

## 2018-12-17 HISTORY — DX: Benign prostatic hyperplasia without lower urinary tract symptoms: N40.0

## 2018-12-17 HISTORY — PX: ESOPHAGOGASTRODUODENOSCOPY (EGD) WITH PROPOFOL: SHX5813

## 2018-12-17 HISTORY — DX: Unspecified mononeuropathy of right upper limb: G56.91

## 2018-12-17 HISTORY — DX: Unspecified asthma, uncomplicated: J45.909

## 2018-12-17 SURGERY — ESOPHAGOGASTRODUODENOSCOPY (EGD) WITH PROPOFOL
Anesthesia: General

## 2018-12-17 MED ORDER — SODIUM CHLORIDE 0.9 % IV SOLN
INTRAVENOUS | Status: DC
  Administered 2018-12-17: 10:00:00 via INTRAVENOUS

## 2018-12-17 MED ORDER — PROPOFOL 10 MG/ML IV BOLUS
INTRAVENOUS | Status: DC | PRN
  Administered 2018-12-17: 100 mg via INTRAVENOUS

## 2018-12-17 MED ORDER — PROPOFOL 500 MG/50ML IV EMUL
INTRAVENOUS | Status: DC | PRN
  Administered 2018-12-17: 125 ug/kg/min via INTRAVENOUS

## 2018-12-17 MED ORDER — LIDOCAINE HCL (CARDIAC) PF 100 MG/5ML IV SOSY
PREFILLED_SYRINGE | INTRAVENOUS | Status: DC | PRN
  Administered 2018-12-17: 100 mg via INTRAVENOUS

## 2018-12-17 MED ORDER — SODIUM CHLORIDE 0.9 % IV SOLN: INTRAVENOUS | Status: DC

## 2018-12-17 NOTE — Op Note (Signed)
[facility] Patient Name: James [name] Procedure Date: 12/17/2018 10:28 AM MRN: [id number] Account #: [id number] Date of Birth: 01/20/1941 Admit Type: Outpatient Age: 78 Room: [facility] ENDO ROOM 3 Gender: Male Note Status: Finalized Procedure:            Upper GI endoscopy Indications:          Dysphagia Providers:            Manya Silvas, MD Referring MD:         Glendon Axe (Referring MD) Medicines:            Propofol per Anesthesia Complications:        No immediate complications. Procedure:            Pre-Anesthesia Assessment:                       - After reviewing the risks and benefits, the patient                        was deemed in satisfactory condition to undergo the                        procedure.                       After obtaining informed consent, the endoscope was                        passed under direct vision. Throughout the procedure,                        the patient's blood pressure, pulse, and oxygen                        saturations were monitored continuously. The Endoscope                        was introduced through the mouth, and advanced to the                        second part of duodenum. The upper GI endoscopy was                        accomplished without difficulty. The patient tolerated                        the procedure well. Findings:      One benign-appearing, intrinsic moderate (circumferential scarring or       stenosis; an endoscope may pass) stenosis was found 40 cm from the       incisors. The stenosis was traversed. There was mild erythematous       gastric mucosa and biopsies done of this area. After the exam was done       of the esophagus, stomach, duodenum A guidewire was placed and the scope       was withdrawn. Dilation was performed with a Savary dilator with mild       resistance at 15 mm, 16 mm and 17 mm. Small amount of visible blood on       the last dilator. Impression:            -  Benign-appearing esophageal stenosis. Dilated.                       - No specimens collected. Recommendation:       - Await pathology results.                       eat slowly, chew well, take small bites. Manya Silvas, MD 12/17/2018 10:58:59 AM This report has been signed electronically. Number of Addenda: 0 Note Initiated On: 12/17/2018 10:28 AM      [facility]

## 2018-12-17 NOTE — Transfer of Care (Signed)
Immediate Anesthesia Transfer of Care Note  Patient: James [name]  Procedure(s) Performed: ESOPHAGOGASTRODUODENOSCOPY (EGD) WITH PROPOFOL (N/A ) BALLOON DILATION (N/A )  Patient Location: PACU and Endoscopy Unit  Anesthesia Type:General  Level of Consciousness: awake  Airway & Oxygen Therapy: Patient Spontanous Breathing  Post-op Assessment: Report given to RN  Post vital signs: stable  Last Vitals:  Vitals Value Taken Time  BP 125/85 12/17/2018 10:56 AM  Temp 36.1 C 12/17/2018 10:54 AM  Pulse 70 12/17/2018 10:57 AM  Resp 21 12/17/2018 10:57 AM  SpO2 97 % 12/17/2018 10:57 AM  Vitals shown include unvalidated device data.  Last Pain:  Vitals:   12/17/18 1054  TempSrc: Tympanic  PainSc: 0-No pain         Complications: No apparent anesthesia complications

## 2018-12-17 NOTE — Anesthesia Postprocedure Evaluation (Signed)
Anesthesia Post Note  Patient: James [name]  Procedure(s) Performed: ESOPHAGOGASTRODUODENOSCOPY (EGD) WITH PROPOFOL (N/A ) BALLOON DILATION (N/A )  Patient location during evaluation: Endoscopy Anesthesia Type: General Level of consciousness: awake and alert Pain management: pain level controlled Vital Signs Assessment: post-procedure vital signs reviewed and stable Respiratory status: spontaneous breathing, nonlabored ventilation and respiratory function stable Cardiovascular status: blood pressure returned to baseline and stable Postop Assessment: no apparent nausea or vomiting Anesthetic complications: no     Last Vitals:  Vitals:   12/17/18 1114 12/17/18 1124  BP: 125/78 128/84  Pulse:    Resp:    Temp:    SpO2:      Last Pain:  Vitals:   12/17/18 1124  TempSrc:   PainSc: 0-No pain                 Alphonsus Sias

## 2018-12-17 NOTE — H&P (Signed)
Primary Care Physician:  Glendon Axe, MD Primary Gastroenterologist:  Dr. Vira Agar  Pre-Procedure History & Physical: HPI:  James [name] is a 78 y.o. male is here for an endoscopy.   Past Medical History:  Diagnosis Date  . Arthritis   . Asthma   . Baker's cyst of knee, right   . BPH (benign prostatic hyperplasia)   . Bronchitis   . Carotid stenosis, left   . Carpal tunnel syndrome, right   . COPD (chronic obstructive pulmonary disease) ([place])   . Dysphagia   . Elevated cholesterol   . Erectile dysfunction   . Gastritis and duodenitis   . GERD (gastroesophageal reflux disease)   . Neuropathy of right forearm   . Schatzki's ring   . SDAT (senile dementia of Alzheimer's type) ([place])   . SDAT (senile dementia of Alzheimer's type) ([place])   . SDAT (senile dementia of Alzheimer's type) ([place])   . Tendinitis   . Vertigo, intermittent   . Weight loss     Past Surgical History:  Procedure Laterality Date  . COLONOSCOPY WITH ESOPHAGOGASTRODUODENOSCOPY (EGD) AND ESOPHAGEAL DILATION (ED)    . ESOPHAGOGASTRODUODENOSCOPY (EGD) WITH PROPOFOL N/A 10/26/2016   Procedure: ESOPHAGOGASTRODUODENOSCOPY (EGD) WITH PROPOFOL;  Surgeon: Manya Silvas, MD;  Location: [facility] ENDOSCOPY;  Service: Endoscopy;  Laterality: N/A;  . HERNIA REPAIR    . SAVORY DILATION N/A 10/26/2016   Procedure: SAVORY DILATION;  Surgeon: Manya Silvas, MD;  Location: [facility] ENDOSCOPY;  Service: Endoscopy;  Laterality: N/A;    Prior to Admission medications   Medication Sig Start Date End Date Taking? Authorizing Provider  BREO ELLIPTA 100-25 MCG/INH AEPB Inhale 1 puff into the lungs daily. 11/19/18  Yes [provider]  doxazosin (CARDURA) 2 MG tablet Take 2 mg by mouth Nightly. 08/07/18 08/07/19 Yes [provider]  Multiple Vitamin (MULTIVITAMIN WITH MINERALS) TABS tablet Take 1 tablet by mouth daily. Centrum.   Yes [provider]  omeprazole (PRILOSEC) 40 MG capsule Take 1 capsule  by mouth daily. 10/25/17  Yes [provider]  PROAIR HFA 108 (90 Base) MCG/ACT inhaler Inhale 1-2 puffs into the lungs every 6 (six) hours as needed. For shortness of breath 12/03/16  Yes [provider]  meclizine (ANTIVERT) 25 MG tablet Take 1 tablet (25 mg total) by mouth 3 (three) times daily as needed for dizziness. Patient not taking: Reported on 12/12/2018 12/05/17   Rudene Re, MD  naproxen sodium (ANAPROX) 220 MG tablet Take 220-440 mg by mouth 2 (two) times daily as needed (for pain.).    [provider]  sildenafil (REVATIO) 20 MG tablet Take 20 mg by mouth as directed. 06/19/18   [provider]    Allergies as of 12/16/2018 - Review Complete 12/16/2018  Allergen Reaction Noted  . Aspirin Hives, Swelling, and Other (See Comments) 10/24/2016    History reviewed. No pertinent family history.  Social History   Socioeconomic History  . Marital status: Married    Spouse name: Not on file  . Number of children: Not on file  . Years of education: Not on file  . Highest education level: Not on file  Occupational History  . Not on file  Social Needs  . Financial resource strain: Not on file  . Food insecurity:    Worry: Not on file    Inability: Not on file  . Transportation needs:    Medical: Not on file    Non-medical: Not on file  Tobacco Use  .  Smoking status: Current Every Day Smoker    Packs/day: 0.25    Years: 50.00    Pack years: 12.50    Types: Cigarettes  . Smokeless tobacco: Never Used  Substance and Sexual Activity  . Alcohol use: Yes    Alcohol/week: 3.0 standard drinks    Types: 3 Cans of beer per week  . Drug use: No  . Sexual activity: Not on file  Lifestyle  . Physical activity:    Days per week: Not on file    Minutes per session: Not on file  . Stress: Not on file  Relationships  . Social connections:    Talks on phone: Not on file    Gets together: Not on file    Attends religious service: Not on file     Active member of club or organization: Not on file    Attends meetings of clubs or organizations: Not on file    Relationship status: Not on file  . Intimate partner violence:    Fear of current or ex partner: Not on file    Emotionally abused: Not on file    Physically abused: Not on file    Forced sexual activity: Not on file  Other Topics Concern  . Not on file  Social History Narrative  . Not on file    Review of Systems: See HPI, otherwise negative ROS  Physical Exam: BP 139/79   Pulse 64   Temp (!) 96.9 F (36.1 C) (Tympanic)   Resp 20   Ht 5\' 11"  (1.803 m)   Wt 70.3 kg   SpO2 98%   BMI 21.62 kg/m  General:   Alert,  pleasant and cooperative in NAD Head:  Normocephalic and atraumatic. Neck:  Supple; no masses or thyromegaly. Lungs:  Clear throughout to auscultation.    Heart:  Regular rate and rhythm. Abdomen:  Soft, nontender and nondistended. Normal bowel sounds, without guarding, and without rebound.   Neurologic:  Alert and  oriented x4;  grossly normal neurologically.  Impression/Plan: James [name] is here for an endoscopy to be performed for dysphagia.  Risks, benefits, limitations, and alternatives regarding  endoscopy have been reviewed with the patient.  Questions have been answered.  All parties agreeable.   Gaylyn Cheers, MD  12/17/2018, 10:38 AM

## 2018-12-17 NOTE — Anesthesia Post-op Follow-up Note (Signed)
Anesthesia QCDR form completed.        

## 2018-12-17 NOTE — Anesthesia Preprocedure Evaluation (Addendum)
Anesthesia Evaluation  Patient identified by MRN, date of birth, ID band Patient awake    Reviewed: Allergy & Precautions, H&P , NPO status , reviewed documented beta blocker date and time   Airway Mallampati: II  TM Distance: >3 FB Neck ROM: limited    Dental  (+) Partial Upper, Chipped, Missing, Dental Advidsory Given   Pulmonary asthma , COPD, Current Smoker,     + decreased breath sounds      Cardiovascular  Rhythm:regular     Neuro/Psych PSYCHIATRIC DISORDERS Anxiety Dementia  Neuromuscular disease    GI/Hepatic GERD  ,  Endo/Other    Renal/GU      Musculoskeletal  (+) Arthritis ,   Abdominal   Peds  Hematology   Anesthesia Other Findings Past Medical History: No date: Arthritis No date: Asthma No date: Baker's cyst of knee, right No date: BPH (benign prostatic hyperplasia) No date: Bronchitis No date: Carotid stenosis, left No date: Carpal tunnel syndrome, right No date: COPD (chronic obstructive pulmonary disease) ([facility]) No date: Dysphagia No date: Elevated cholesterol No date: Erectile dysfunction No date: Gastritis and duodenitis No date: GERD (gastroesophageal reflux disease) No date: Neuropathy of right forearm No date: Schatzki's ring No date: SDAT (senile dementia of Alzheimer's type) ([facility]) No date: SDAT (senile dementia of Alzheimer's type) (H[facility]) No date: SDAT (senile dementia of Alzheimer's type) (H[facility]) No date: Tendinitis No date: Vertigo, intermittent No date: Weight loss  Past Surgical History: No date: COLONOSCOPY WITH ESOPHAGOGASTRODUODENOSCOPY (EGD) AND  ESOPHAGEAL DILATION (ED) 10/26/2016: ESOPHAGOGASTRODUODENOSCOPY (EGD) WITH PROPOFOL; N/A     Comment:  Procedure: ESOPHAGOGASTRODUODENOSCOPY (EGD) WITH               PROPOFOL;  Surgeon: Manya Silvas, MD;  Location: [facility]              ENDOSCOPY;  Service: Endoscopy;  Laterality: N/A; No date: HERNIA REPAIR 10/26/2016: SAVORY  DILATION; N/A     Comment:  Procedure: SAVORY DILATION;  Surgeon: Manya Silvas,               MD;  Location: [facility] ENDOSCOPY;  Service: Endoscopy;                Laterality: N/A;  BMI    Body Mass Index:  21.62 kg/m      Reproductive/Obstetrics                            Anesthesia Physical Anesthesia Plan  ASA: III  Anesthesia Plan: General   Post-op Pain Management:    Induction: Intravenous  PONV Risk Score and Plan: 1 and Treatment may vary due to age or medical condition and TIVA  Airway Management Planned: Nasal Cannula and Natural Airway  Additional Equipment:   Intra-op Plan:   Post-operative Plan:   Informed Consent: I have reviewed the patients History and Physical, chart, labs and discussed the procedure including the risks, benefits and alternatives for the proposed anesthesia with the patient or authorized representative who has indicated his/her understanding and acceptance.     Dental Advisory Given  Plan Discussed with:   Anesthesia Plan Comments:        Anesthesia Quick Evaluation

## 2018-12-18 ENCOUNTER — Encounter: Payer: Self-pay | Admitting: Unknown Physician Specialty

## 2018-12-18 LAB — SURGICAL PATHOLOGY

## 2019-01-14 DIAGNOSIS — D0422 Carcinoma in situ of skin of left ear and external auricular canal: Secondary | ICD-10-CM | POA: Diagnosis not present

## 2019-04-03 DIAGNOSIS — N401 Enlarged prostate with lower urinary tract symptoms: Secondary | ICD-10-CM | POA: Diagnosis not present

## 2019-04-03 DIAGNOSIS — Z0000 Encounter for general adult medical examination without abnormal findings: Secondary | ICD-10-CM | POA: Diagnosis not present

## 2019-04-03 DIAGNOSIS — Z79899 Other long term (current) drug therapy: Secondary | ICD-10-CM | POA: Diagnosis not present

## 2019-04-03 DIAGNOSIS — R3914 Feeling of incomplete bladder emptying: Secondary | ICD-10-CM | POA: Diagnosis not present

## 2019-06-15 DIAGNOSIS — F17200 Nicotine dependence, unspecified, uncomplicated: Secondary | ICD-10-CM | POA: Diagnosis not present

## 2019-06-15 DIAGNOSIS — R070 Pain in throat: Secondary | ICD-10-CM | POA: Diagnosis not present

## 2019-06-15 DIAGNOSIS — K219 Gastro-esophageal reflux disease without esophagitis: Secondary | ICD-10-CM | POA: Diagnosis not present

## 2019-06-15 DIAGNOSIS — H6123 Impacted cerumen, bilateral: Secondary | ICD-10-CM | POA: Diagnosis not present

## 2019-06-15 DIAGNOSIS — H903 Sensorineural hearing loss, bilateral: Secondary | ICD-10-CM | POA: Diagnosis not present

## 2019-06-18 DIAGNOSIS — S81811A Laceration without foreign body, right lower leg, initial encounter: Secondary | ICD-10-CM | POA: Diagnosis not present

## 2019-06-23 DIAGNOSIS — D2262 Melanocytic nevi of left upper limb, including shoulder: Secondary | ICD-10-CM | POA: Diagnosis not present

## 2019-06-23 DIAGNOSIS — X32XXXA Exposure to sunlight, initial encounter: Secondary | ICD-10-CM | POA: Diagnosis not present

## 2019-06-23 DIAGNOSIS — D485 Neoplasm of uncertain behavior of skin: Secondary | ICD-10-CM | POA: Diagnosis not present

## 2019-06-23 DIAGNOSIS — C44622 Squamous cell carcinoma of skin of right upper limb, including shoulder: Secondary | ICD-10-CM | POA: Diagnosis not present

## 2019-06-23 DIAGNOSIS — D2261 Melanocytic nevi of right upper limb, including shoulder: Secondary | ICD-10-CM | POA: Diagnosis not present

## 2019-06-23 DIAGNOSIS — D225 Melanocytic nevi of trunk: Secondary | ICD-10-CM | POA: Diagnosis not present

## 2019-06-23 DIAGNOSIS — L570 Actinic keratosis: Secondary | ICD-10-CM | POA: Diagnosis not present

## 2019-06-23 DIAGNOSIS — L720 Epidermal cyst: Secondary | ICD-10-CM | POA: Diagnosis not present

## 2019-06-23 DIAGNOSIS — Z85828 Personal history of other malignant neoplasm of skin: Secondary | ICD-10-CM | POA: Diagnosis not present

## 2019-06-29 DIAGNOSIS — C44622 Squamous cell carcinoma of skin of right upper limb, including shoulder: Secondary | ICD-10-CM | POA: Diagnosis not present

## 2019-07-27 DIAGNOSIS — R070 Pain in throat: Secondary | ICD-10-CM | POA: Diagnosis not present

## 2019-07-27 DIAGNOSIS — K219 Gastro-esophageal reflux disease without esophagitis: Secondary | ICD-10-CM | POA: Diagnosis not present

## 2019-08-24 DIAGNOSIS — L720 Epidermal cyst: Secondary | ICD-10-CM | POA: Diagnosis not present

## 2019-08-24 DIAGNOSIS — D485 Neoplasm of uncertain behavior of skin: Secondary | ICD-10-CM | POA: Diagnosis not present

## 2019-08-24 DIAGNOSIS — L538 Other specified erythematous conditions: Secondary | ICD-10-CM | POA: Diagnosis not present

## 2019-09-29 DIAGNOSIS — Z85828 Personal history of other malignant neoplasm of skin: Secondary | ICD-10-CM | POA: Diagnosis not present

## 2019-09-29 DIAGNOSIS — X32XXXA Exposure to sunlight, initial encounter: Secondary | ICD-10-CM | POA: Diagnosis not present

## 2019-09-29 DIAGNOSIS — Z08 Encounter for follow-up examination after completed treatment for malignant neoplasm: Secondary | ICD-10-CM | POA: Diagnosis not present

## 2019-09-29 DIAGNOSIS — L821 Other seborrheic keratosis: Secondary | ICD-10-CM | POA: Diagnosis not present

## 2019-09-29 DIAGNOSIS — L570 Actinic keratosis: Secondary | ICD-10-CM | POA: Diagnosis not present

## 2019-11-30 DIAGNOSIS — J449 Chronic obstructive pulmonary disease, unspecified: Secondary | ICD-10-CM | POA: Diagnosis not present

## 2019-11-30 DIAGNOSIS — R634 Abnormal weight loss: Secondary | ICD-10-CM | POA: Diagnosis not present

## 2019-11-30 DIAGNOSIS — G301 Alzheimer's disease with late onset: Secondary | ICD-10-CM | POA: Diagnosis not present

## 2019-11-30 DIAGNOSIS — Z79899 Other long term (current) drug therapy: Secondary | ICD-10-CM | POA: Diagnosis not present

## 2019-11-30 DIAGNOSIS — F0280 Dementia in other diseases classified elsewhere without behavioral disturbance: Secondary | ICD-10-CM | POA: Diagnosis not present

## 2020-01-06 DIAGNOSIS — Z08 Encounter for follow-up examination after completed treatment for malignant neoplasm: Secondary | ICD-10-CM | POA: Diagnosis not present

## 2020-01-06 DIAGNOSIS — D485 Neoplasm of uncertain behavior of skin: Secondary | ICD-10-CM | POA: Diagnosis not present

## 2020-01-06 DIAGNOSIS — L570 Actinic keratosis: Secondary | ICD-10-CM | POA: Diagnosis not present

## 2020-01-06 DIAGNOSIS — X32XXXA Exposure to sunlight, initial encounter: Secondary | ICD-10-CM | POA: Diagnosis not present

## 2020-01-06 DIAGNOSIS — Z85828 Personal history of other malignant neoplasm of skin: Secondary | ICD-10-CM | POA: Diagnosis not present

## 2020-01-06 DIAGNOSIS — C44629 Squamous cell carcinoma of skin of left upper limb, including shoulder: Secondary | ICD-10-CM | POA: Diagnosis not present

## 2020-01-06 DIAGNOSIS — L309 Dermatitis, unspecified: Secondary | ICD-10-CM | POA: Diagnosis not present

## 2020-02-04 IMAGING — CT CT HEAD W/O CM
3 series · 15 of 47 positions shown, 18 images · non-contrast
Comparison: None.

CLINICAL DATA: Severe headaches.

EXAM:
CT HEAD WITHOUT CONTRAST
TECHNIQUE: Contiguous axial images were obtained from the base of the skull
through the vertex without intravenous contrast.

[Series 2: head wo · axial · 0.44mm/px · z∈[-88,+37]mm · 9 of 30 slices shown, 12 images]
[im 3/30  brain]
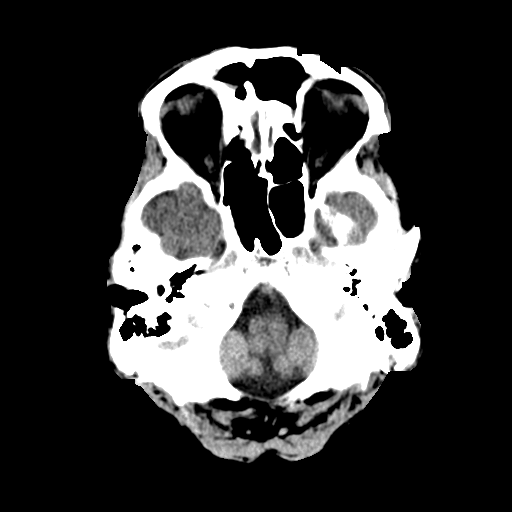
[im 3/30  bone]
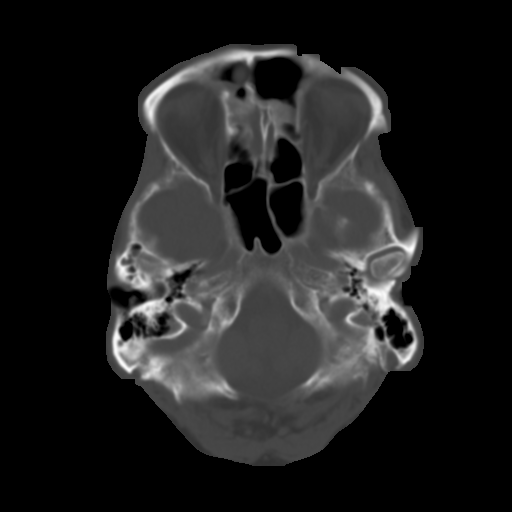
[im 6/30  brain]
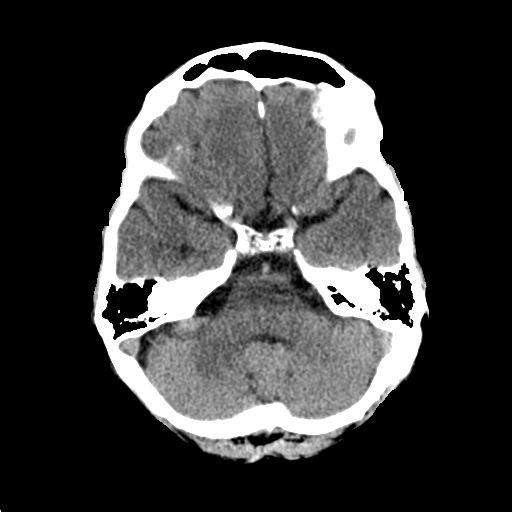
[im 9/30  brain]
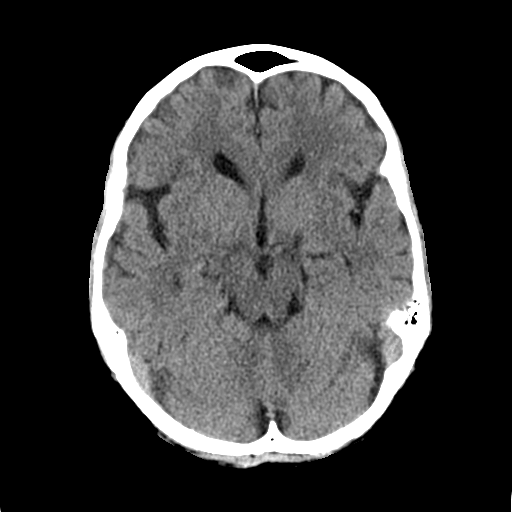
[im 12/30  brain]
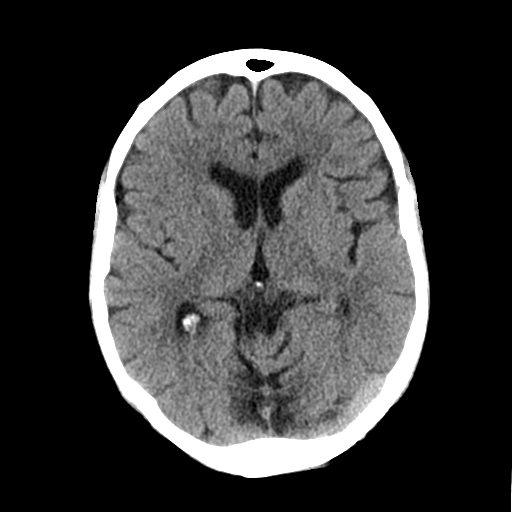
[im 16/30  brain]
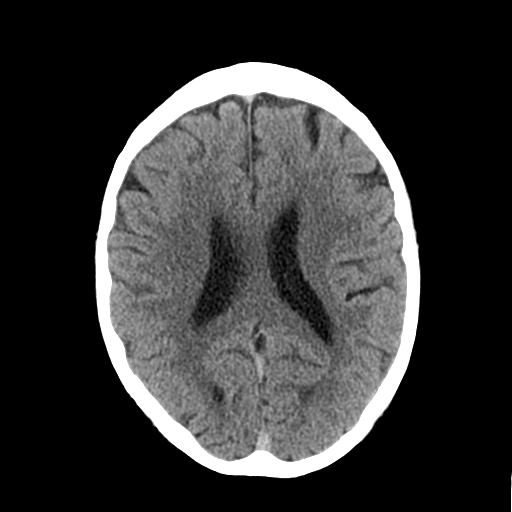
[im 16/30  bone]
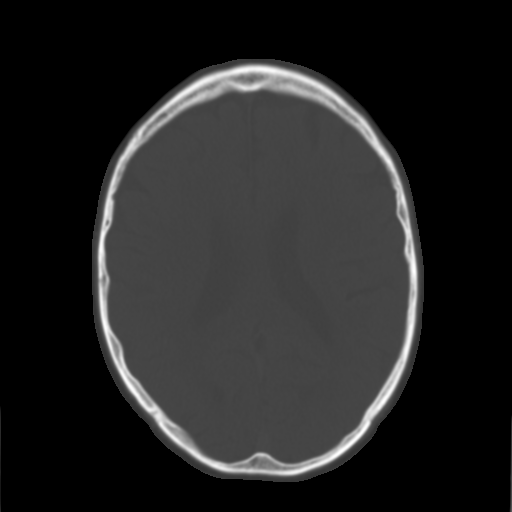
[im 19/30  brain]
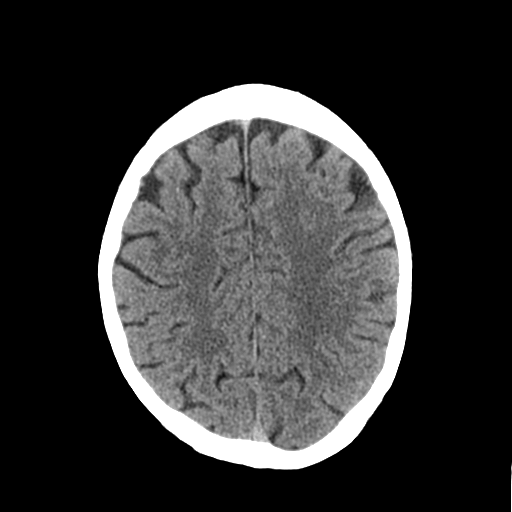
[im 22/30  brain]
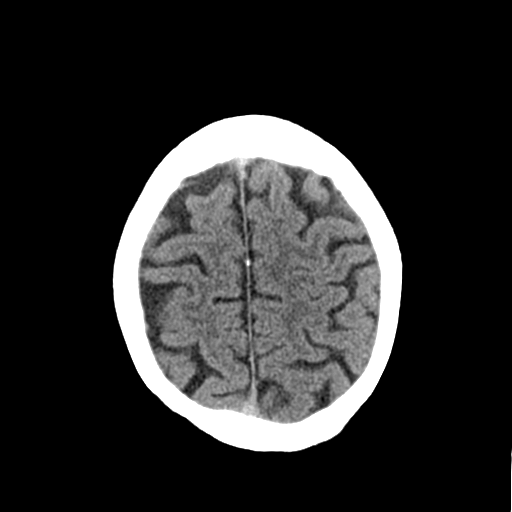
[im 25/30  brain]
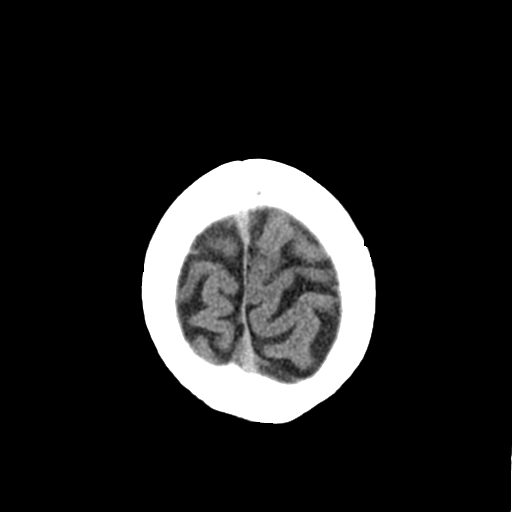
[im 28/30  brain]
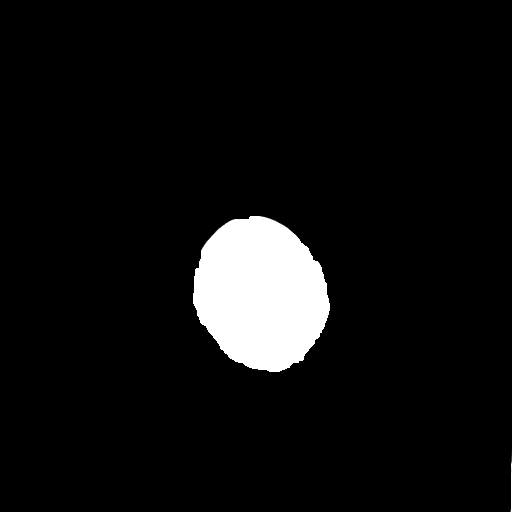
[im 28/30  bone]
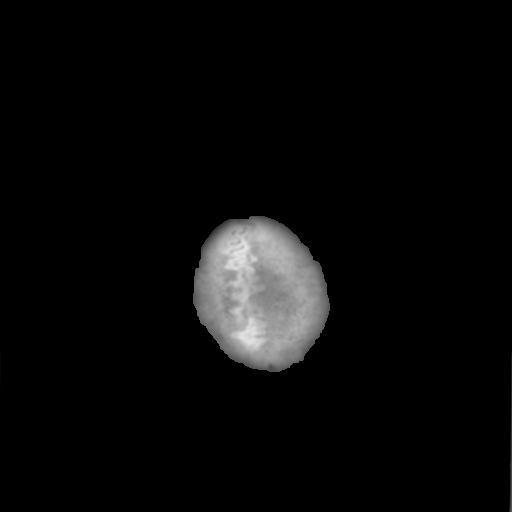

[Series 4: coronal soft tissue · coronal · 0.32mm/px · 3 of 66 slices shown]
[im 22/66  brain]
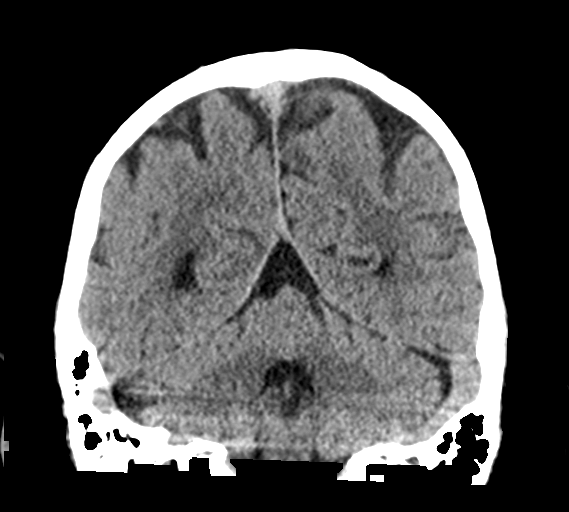
[im 29/66  brain]
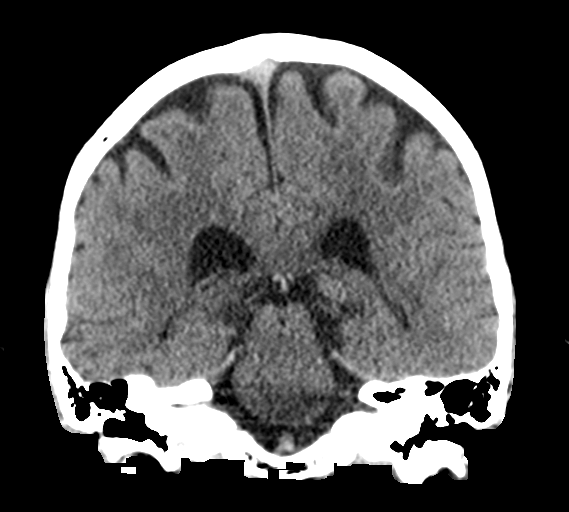
[im 37/66  brain]
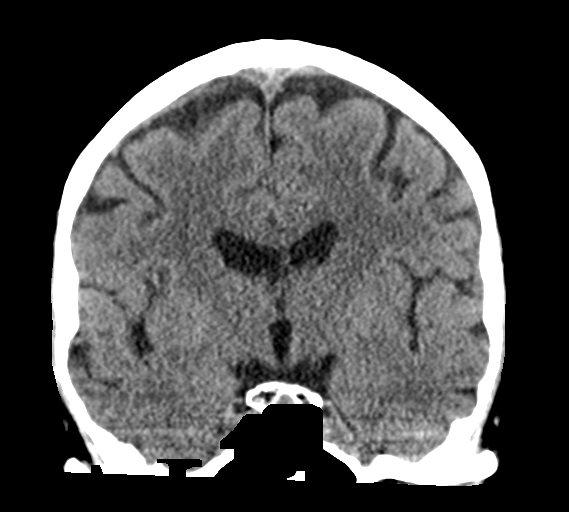

[Series 5: sagittal soft tissue · sagittal · 0.32mm/px · 3 of 52 slices shown]
[im 18/52  brain]
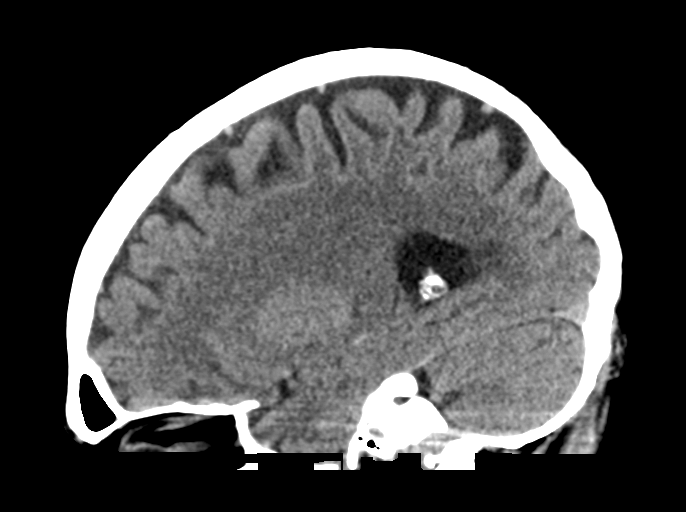
[im 26/52  brain]
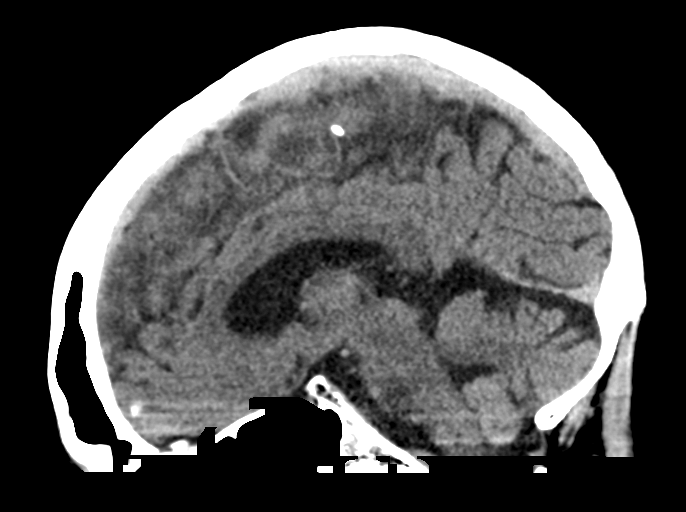
[im 35/52  brain]
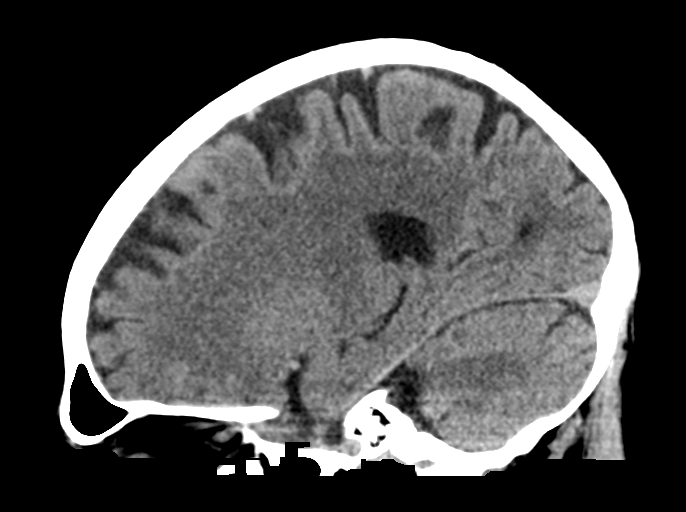

[15 of 47 positions shown; findings below may reference images not displayed]

FINDINGS: Brain: No evidence of acute infarction, hemorrhage, hydrocephalus,
extra-axial collection or mass lesion/mass effect. There is mild
diffuse atrophy.

Vascular: No hyperdense vessel or unexpected calcification.

Skull: Normal. Negative for fracture or focal lesion.

Sinuses/Orbits: Mucoperiosteal thickening of bilateral ethmoid,
bilateral frontal sinuses are identified. The orbits are normal.

Other: None.
IMPRESSION: No focal acute intracranial abnormality identified.

Mucoperiosteal thickening of bilateral ethmoid and bilateral frontal
sinuses are identified. Sinusitis is not excluded.

## 2020-02-11 DIAGNOSIS — C44629 Squamous cell carcinoma of skin of left upper limb, including shoulder: Secondary | ICD-10-CM | POA: Diagnosis not present

## 2021-02-10 IMAGING — CR DG CHEST 2V
1 series · 2 of 2 positions shown · non-contrast
Comparison: PA and lateral chest 05/14/2014 and 11/01/2009.

CLINICAL DATA: Chest burning for 3 weeks.

EXAM:
CHEST - 2 VIEW

[Series 1: dg chest 2 view · 0.14mm/px · 2 of 2 slices shown]
[im 1/2]
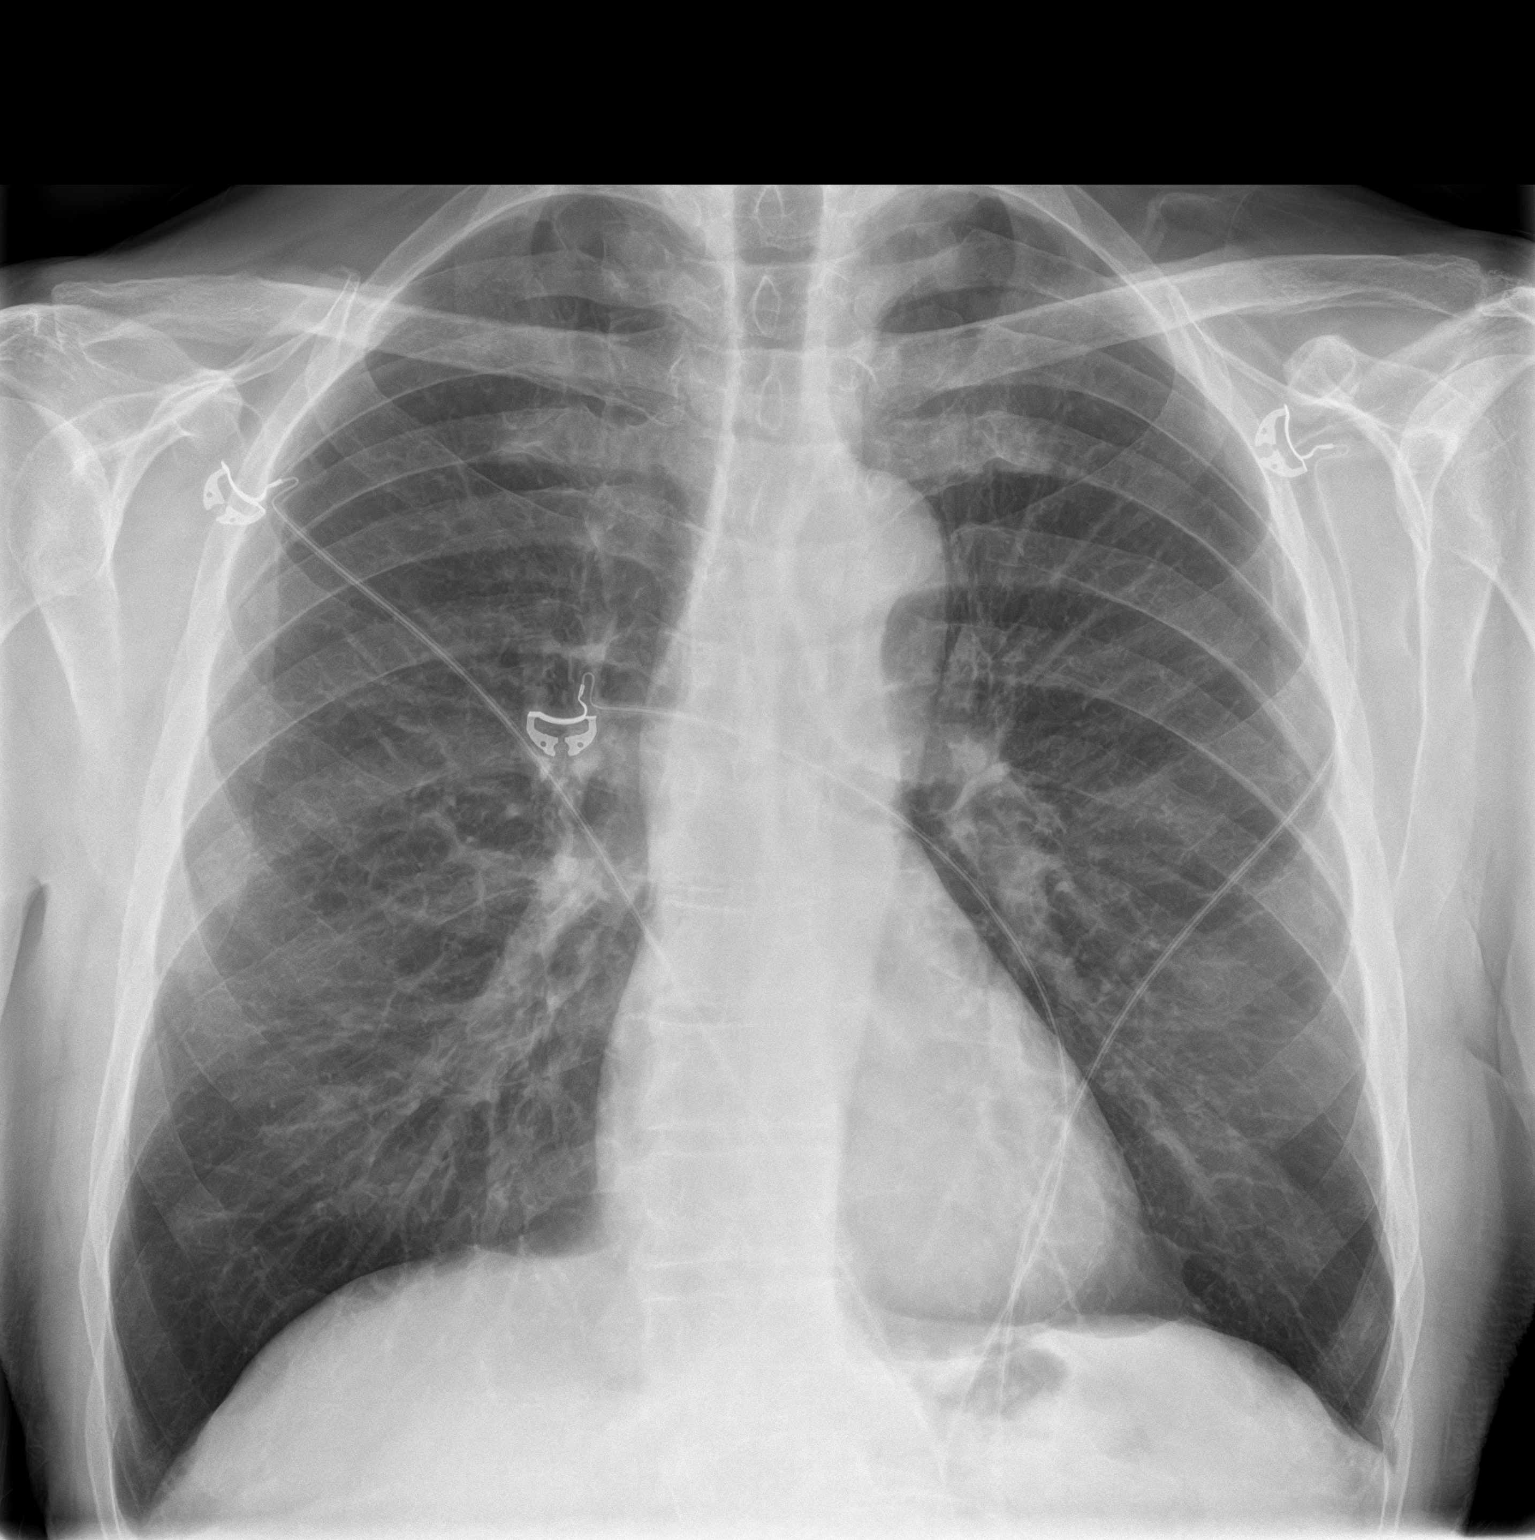
[im 2/2]
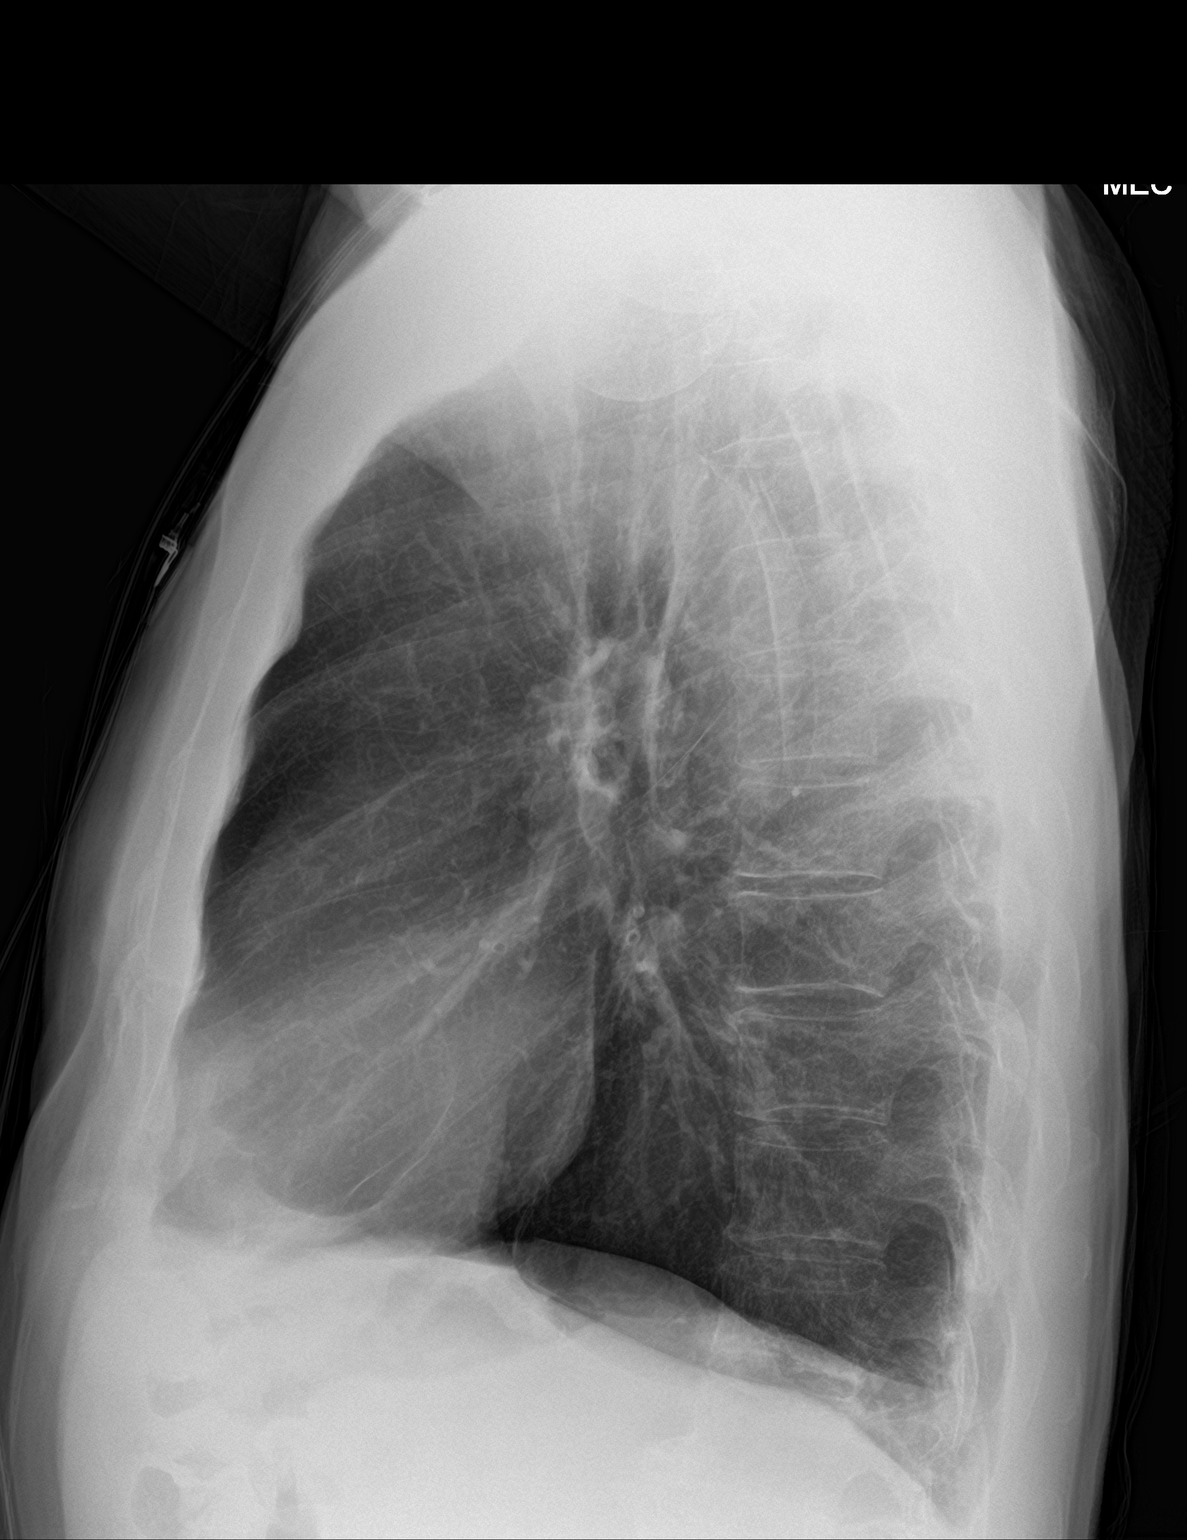

[2 of 2 positions shown; findings below may reference images not displayed]

FINDINGS: The lungs are emphysematous but clear. Heart size is normal. No
pneumothorax or pleural effusion. No acute or focal bony
abnormality.
IMPRESSION: Emphysema without acute disease.

## 2021-06-29 ENCOUNTER — Ambulatory Visit: Payer: Medicare (Managed Care) | Admitting: Family Medicine

## 2021-06-29 ENCOUNTER — Other Ambulatory Visit: Payer: Self-pay

## 2021-06-29 DIAGNOSIS — Z5989 Other problems related to housing and economic circumstances: Secondary | ICD-10-CM

## 2021-06-29 NOTE — Progress Notes (Signed)
Patient arrived and insurance was not in network. He was not seen today due to insurance network problem

## 2021-08-08 ENCOUNTER — Encounter: Payer: Self-pay | Admitting: Family Medicine

## 2021-08-08 ENCOUNTER — Ambulatory Visit (INDEPENDENT_AMBULATORY_CARE_PROVIDER_SITE_OTHER): Payer: PPO | Admitting: Family Medicine

## 2021-08-08 ENCOUNTER — Other Ambulatory Visit: Payer: Self-pay

## 2021-08-08 VITALS — BP 133/68 | HR 62 | Ht 71.0 in | Wt 144.0 lb

## 2021-08-08 DIAGNOSIS — K219 Gastro-esophageal reflux disease without esophagitis: Secondary | ICD-10-CM | POA: Diagnosis not present

## 2021-08-08 DIAGNOSIS — Z720 Tobacco use: Secondary | ICD-10-CM

## 2021-08-08 DIAGNOSIS — M1811 Unilateral primary osteoarthritis of first carpometacarpal joint, right hand: Secondary | ICD-10-CM

## 2021-08-08 DIAGNOSIS — M19031 Primary osteoarthritis, right wrist: Secondary | ICD-10-CM

## 2021-08-08 DIAGNOSIS — N401 Enlarged prostate with lower urinary tract symptoms: Secondary | ICD-10-CM | POA: Diagnosis not present

## 2021-08-08 DIAGNOSIS — Z7689 Persons encountering health services in other specified circumstances: Secondary | ICD-10-CM | POA: Diagnosis not present

## 2021-08-08 DIAGNOSIS — J432 Centrilobular emphysema: Secondary | ICD-10-CM | POA: Insufficient documentation

## 2021-08-08 DIAGNOSIS — R3911 Hesitancy of micturition: Secondary | ICD-10-CM

## 2021-08-08 DIAGNOSIS — Z23 Encounter for immunization: Secondary | ICD-10-CM

## 2021-08-08 NOTE — Patient Instructions (Addendum)
Thank you for coming to the office today.  Keep on current medication can refill if needed.  START anti inflammatory topical - OTC Voltaren (generic Diclofenac) topical 2-4 times a day as needed for pain swelling of affected joint for 1-2 weeks or longer.   Stay tuned for apt from Dr Pernell Dupre [facility] Address: [address], [place], [place] [address] Hours:  9AM-5PM Phone: [phone]   DUE for FASTING BLOOD WORK (no food or drink after midnight before the lab appointment, only water or coffee without cream/sugar on the morning of)  SCHEDULE "Lab Only" visit in the morning at the clinic for lab draw in 3 MONTHS   - Make sure Lab Only appointment is at about 1 week before your next appointment, so that results will be available  For Lab Results, once available within 2-3 days of blood draw, you can can log in to MyChart online to view your results and a brief explanation. Also, we can discuss results at next follow-up visit.    Please schedule a Follow-up Appointment to: Return for re-schedule upcoming physical to AM (instead of PM) in December can do fasting for Labs AFTER.  If you have any other questions or concerns, please feel free to call the office or send a message through [place]. You may also schedule an earlier appointment if necessary.  Additionally, you may be receiving a survey about your experience at our office within a few days to 1 week by e-mail or mail. We value your feedback.  Nobie Putnam, DO [place]

## 2021-08-08 NOTE — Progress Notes (Signed)
Subjective:    Patient ID: James [name], male    DOB: [birth year], 80 y.o.   MRN: [id number]  James [name] is a 80 y.o. male presenting on 08/08/2021 for Hypertension  Previously living in [facility]. His wife has passed. He moved back to [place] with daughter in June 2022.    HPI  He had some L groin pain that has since resolved   BPH LUTS He takes Doxazosin 2mg  x 2 = 4mg  daily in AM. He has difficulty starting urination.  Centrilobular Emphysema Tobacco Abuse Active smoker, quarter pack per day 6-7 per day He can do what he needs to do daily activities On Breo inhaler daily, Albuterol PRN  GERD Controlled on Omeprazole 40mg  daily  History of Alzheimer's Dementia Previously on Donepezil 10mg  but says did not help. Has come off med.  Osteoarthritis R Wrist and Thumb Right Hand/Wrist Pain History of wear and tear to R hand wrist playing Force He has seen Orthopedic in past 6-7 months in [place], he was seen by [facility] potentially about 3 years ago has had prior injection limited success. Request return now.  Health Maintenance: Due for Flu Shot, will receive today    Depression screen [facility] 2/9 08/08/2021  Decreased Interest 0  Down, Depressed, Hopeless 0  PHQ - 2 Score 0    Past Medical History:  Diagnosis Date   Arthritis    Asthma    Baker's cyst of knee, right    BPH (benign prostatic hyperplasia)    Bronchitis    Carotid stenosis, left    Carpal tunnel syndrome, right    COPD (chronic obstructive pulmonary disease) (H[facility])    Dysphagia    Elevated cholesterol    Erectile dysfunction    Gastritis and duodenitis    GERD (gastroesophageal reflux disease)    Neuropathy of right forearm    Schatzki's ring    Tendinitis    Vertigo, intermittent    Weight loss    Past Surgical History:  Procedure Laterality Date   BALLOON DILATION N/A 12/17/2018   Procedure: BALLOON DILATION;  Surgeon: Manya Silvas, MD;  Location: [facility] ENDOSCOPY;   Service: Endoscopy;  Laterality: N/A;   COLONOSCOPY WITH ESOPHAGOGASTRODUODENOSCOPY (EGD) AND ESOPHAGEAL DILATION (ED)     ESOPHAGOGASTRODUODENOSCOPY (EGD) WITH PROPOFOL N/A 10/26/2016   Procedure: ESOPHAGOGASTRODUODENOSCOPY (EGD) WITH PROPOFOL;  Surgeon: Manya Silvas, MD;  Location: [facility] ENDOSCOPY;  Service: Endoscopy;  Laterality: N/A;   ESOPHAGOGASTRODUODENOSCOPY (EGD) WITH PROPOFOL N/A 12/17/2018   Procedure: ESOPHAGOGASTRODUODENOSCOPY (EGD) WITH PROPOFOL;  Surgeon: Manya Silvas, MD;  Location: [facility] ENDOSCOPY;  Service: Endoscopy;  Laterality: N/A;   HERNIA REPAIR     SAVORY DILATION N/A 10/26/2016   Procedure: SAVORY DILATION;  Surgeon: Manya Silvas, MD;  Location: [facility] ENDOSCOPY;  Service: Endoscopy;  Laterality: N/A;   Social History   Socioeconomic History   Marital status: Married    Spouse name: Not on file   Number of children: Not on file   Years of education: Not on file   Highest education level: Not on file  Occupational History   Not on file  Tobacco Use   Smoking status: Every Day    Packs/day: 0.25    Years: 50.00    Pack years: 12.50    Types: Cigarettes   Smokeless tobacco: Never  Vaping Use   Vaping Use: Never used  Substance and Sexual Activity   Alcohol use: Yes    Alcohol/week: 3.0 standard drinks  Types: 3 Cans of beer per week   Drug use: No   Sexual activity: Not on file  Other Topics Concern   Not on file  Social History Narrative   Not on file   Social Determinants of Health   Financial Resource Strain: Not on file  Food Insecurity: Not on file  Transportation Needs: Not on file  Physical Activity: Not on file  Stress: Not on file  Social Connections: Not on file  Intimate Partner Violence: Not on file   History reviewed. No pertinent family history. Current Outpatient Medications on File Prior to Visit  Medication Sig   BREO ELLIPTA 100-25 MCG/INH AEPB Inhale 1 puff into the lungs daily.   meclizine (ANTIVERT) 25 MG  tablet Take 1 tablet (25 mg total) by mouth 3 (three) times daily as needed for dizziness.   Multiple Vitamin (MULTIVITAMIN WITH MINERALS) TABS tablet Take 1 tablet by mouth daily. Centrum.   naproxen sodium (ANAPROX) 220 MG tablet Take 220-440 mg by mouth 2 (two) times daily as needed (for pain.).   omeprazole (PRILOSEC) 40 MG capsule Take 1 capsule by mouth daily.   PROAIR HFA 108 (90 Base) MCG/ACT inhaler Inhale 1-2 puffs into the lungs every 6 (six) hours as needed. For shortness of breath   sildenafil (REVATIO) 20 MG tablet Take 20 mg by mouth as directed.   doxazosin (CARDURA) 2 MG tablet Take 4 mg by mouth daily.   No current facility-administered medications on file prior to visit.    Review of Systems Per HPI unless specifically indicated above     Objective:    BP 133/68   Pulse 62   Ht 5\' 11"  (1.803 m)   Wt 144 lb (65.3 kg)   SpO2 98%   BMI 20.08 kg/m   Wt Readings from Last 3 Encounters:  08/08/21 144 lb (65.3 kg)  12/17/18 155 lb (70.3 kg)  12/12/18 155 lb (70.3 kg)    Physical Exam Vitals and nursing note reviewed.  Constitutional:      General: He is not in acute distress.    Appearance: Normal appearance. He is well-developed. He is not diaphoretic.     Comments: Well-appearing, comfortable, cooperative  HENT:     Head: Normocephalic and atraumatic.  Eyes:     General:        Right eye: No discharge.        Left eye: No discharge.     Conjunctiva/sclera: Conjunctivae normal.  Cardiovascular:     Rate and Rhythm: Normal rate.  Pulmonary:     Effort: Pulmonary effort is normal.  Skin:    General: Skin is warm and dry.     Findings: No erythema or rash.  Neurological:     Mental Status: He is alert and oriented to person, place, and time.  Psychiatric:        Mood and Affect: Mood normal.        Behavior: Behavior normal.        Thought Content: Thought content normal.     Comments: Well groomed, good eye contact, normal speech and thoughts    Results for orders placed or performed during the hospital encounter of 12/17/18  Surgical pathology  Result Value Ref Range   SURGICAL PATHOLOGY      Surgical Pathology CASE: [id number] PATIENT: Beatrice Lecher Surgical Pathology Report     SPECIMEN SUBMITTED: A. Stomach, body; cbx  CLINICAL HISTORY: None provided  PRE-OPERATIVE DIAGNOSIS: Dysphagia  POST-OPERATIVE DIAGNOSIS: Schatzki's Ring, hiatal  hernia, mild gastritis body of stomach     DIAGNOSIS: A. STOMACH, BODY; COLD BIOPSY: - OXYNTIC MUCOSA WITH MINIMAL CHRONIC GASTRITIS AND OXYNTIC GLAND HYPERPLASIA. - NEGATIVE FOR H. PYLORI, DYSPLASIA AND MALIGNANCY.  Note: The changes identified in the oxyntic mucosa are nonspecific and may be seen in patients on proton pump inhibitor therapy, patients with gastric ulcers, morbid obesity, or H. pylori gastritis. Correlation with clinical and endoscopic findings is required.   GROSS DESCRIPTION: A. Labeled: C BX gastric body Received: Formalin Tissue fragment(s): 2 Size: 0.3-0.4 cm Description: Tan soft tissue fragments Entirely submitted in 1 cassette.   Final Diagnosis performed by Argie Ramming s, MD.   Electronically signed 12/18/2018 2:42:56PM The electronic signature indicates that the named Attending Pathologist has evaluated the specimen  Technical component performed at [facility], [address], [place], [place] [address] Lab: [phone] Dir: Rush Farmer, MD, MMM  Professional component performed at [facility], [facility], [place], [place], [place] [address] Lab: [phone] Dir: Dellia Nims. Rubinas, MD       Assessment & Plan:   Problem List Items Addressed This Visit     Gastroesophageal reflux disease without esophagitis - Primary   Centrilobular emphysema ([place])   Benign prostatic hyperplasia with urinary hesitancy   Other Visit Diagnoses     Encounter to establish care with new doctor       Needs flu shot        Relevant Orders   Flu Vaccine QUAD High Dose(Fluad) (Completed)   Tobacco abuse       Primary osteoarthritis of right wrist       Relevant Orders   Ambulatory referral to Orthopedic Surgery   Primary osteoarthritis of first carpometacarpal joint of right hand       Relevant Orders   Ambulatory referral to Orthopedic Surgery       OA/DJD R Hand wrist thumb Chronic problem, due to long history overuse Prior limited success with inj and therapy per ortho Now will return to Orthopedic, refer to [facility] Leretha Pol  BPH LUTS Continue Doxazosin 4mg  daily, no new re order needed  Centrilobular Emphysema Stable on inhalers Active smoker, counsel on quitting.   Orders Placed This Encounter  Procedures   Flu Vaccine QUAD High Dose(Fluad)   Ambulatory referral to Orthopedic Surgery    Referral Priority:   Routine    Referral Type:   Surgical    Referral Reason:   Specialty Services Required    Requested Specialty:   Orthopedic Surgery    Number of Visits Requested:   1     No orders of the defined types were placed in this encounter.     Follow up plan: Return for re-schedule upcoming physical to AM (instead of PM) in December can do fasting for Labs AFTER.  Nobie Putnam, DO [facility] 08/08/2021, 8:52 AM

## 2021-08-21 DIAGNOSIS — L821 Other seborrheic keratosis: Secondary | ICD-10-CM | POA: Diagnosis not present

## 2021-08-21 DIAGNOSIS — Z85828 Personal history of other malignant neoplasm of skin: Secondary | ICD-10-CM | POA: Diagnosis not present

## 2021-08-21 DIAGNOSIS — X32XXXA Exposure to sunlight, initial encounter: Secondary | ICD-10-CM | POA: Diagnosis not present

## 2021-08-21 DIAGNOSIS — Z08 Encounter for follow-up examination after completed treatment for malignant neoplasm: Secondary | ICD-10-CM | POA: Diagnosis not present

## 2021-08-21 DIAGNOSIS — L570 Actinic keratosis: Secondary | ICD-10-CM | POA: Diagnosis not present

## 2021-08-21 DIAGNOSIS — H61002 Unspecified perichondritis of left external ear: Secondary | ICD-10-CM | POA: Diagnosis not present

## 2021-08-21 DIAGNOSIS — D0422 Carcinoma in situ of skin of left ear and external auricular canal: Secondary | ICD-10-CM | POA: Diagnosis not present

## 2021-08-21 DIAGNOSIS — D485 Neoplasm of uncertain behavior of skin: Secondary | ICD-10-CM | POA: Diagnosis not present

## 2021-08-21 DIAGNOSIS — D225 Melanocytic nevi of trunk: Secondary | ICD-10-CM | POA: Diagnosis not present

## 2021-08-22 DIAGNOSIS — M1811 Unilateral primary osteoarthritis of first carpometacarpal joint, right hand: Secondary | ICD-10-CM | POA: Diagnosis not present

## 2021-08-31 ENCOUNTER — Ambulatory Visit
Admission: RE | Admit: 2021-08-31 | Discharge: 2021-08-31 | Disposition: A | Discharge status: Home / Self Care | Payer: PPO | Source: Ambulatory Visit | Attending: Emergency Medicine | Admitting: Emergency Medicine

## 2021-08-31 ENCOUNTER — Other Ambulatory Visit: Payer: Self-pay

## 2021-08-31 VITALS — BP 129/72 | HR 73 | Temp 98.5°F

## 2021-08-31 DIAGNOSIS — J101 Influenza due to other identified influenza virus with other respiratory manifestations: Secondary | ICD-10-CM

## 2021-08-31 LAB — POCT INFLUENZA A/B
Influenza A, POC: POSITIVE — AB
Influenza B, POC: NEGATIVE

## 2021-08-31 NOTE — ED Triage Notes (Addendum)
Pt presents with nausea, fatigue, bodyaches, HA and dizziness that started 6 days ago.

## 2021-08-31 NOTE — Discharge Instructions (Addendum)
You have been diagnosed with influenza. This is typically a self-limiting virus that does not require antibiotics. Most people will have symptoms for about 7 - 10 days. Pay special attention to handwashing as this can prevent spread of the virus.   Always read the labels of cough and cold medications as they may contain some of the ingredients below.  Rest, push lots of fluids (especially water), and utilize supportive care for symptoms. You may take acetaminophen (Tylenol) every 4-6 hours and ibuprofen every 6-8 hours for muscle pain, joint pain, headaches (you may also alternate these medications). Mucinex (guaifenesin) may be taken over the counter for cough as needed can loosen phlegm. Please read the instructions and take as directed.  Sudafed (pseudophedrine) is sold behind the counter and can help reduce nasal pressure; avoid taking this if you have high blood pressure or feel jittery. Sudafed PE (phenylephrine) can be a helpful, short-term, over-the-counter alternative to limit side effects or if you have high blood pressure.  Flonase nasal spray can help alleviate congestion and sinus pressure. Many patients choose Afrin as a nasal decongestant; do not use for more than 3 days for risk of rebound (increased symptoms after stopping medication).  Saline nasal sprays or rinses can also help nasal congestion (use bottled or sterile water). Warm tea with lemon and honey can sooth sore throat and cough, as can cough drops.   Return to clinic for high fever not improving with medications, chest pain, difficulty breathing, non-stop vomiting, or coughing blood. Follow-up with your primary care provider if symptoms do not improve as expected in the next 5-7 days.

## 2021-08-31 NOTE — ED Provider Notes (Signed)
Name: James [name] [facility] Address: [place] [address] MRN: [id number] DOB: [birth year] Age: 80 y.o. Gender: male Encounter Date: 08/31/2021 Primary Provider: Olin Hauser, DO  S: This is a 80 y.o. male with a 6 day history of flulike symptoms   +Myalgias and arthralgia.  +fatigue  - cough  - nasal congestion w/clear rhinorrhea  - vomiting or diarrhea  Flu exposure: + Daughter   The following portions of the patient's history were reviewed and updated in Epic as appropriate: allergies, current medications, past medical history, past social history, past surgical history and problem list.   O:   Vitals:   08/31/21 1352  BP: 129/72  Pulse: 73  Temp: 98.5 F (36.9 C)  SpO2: 94%   Gen: WDWN, NAD   HEENT:NCAT, EOMI, EACs clear, TMS clear bilaterally  Nares without discharge; +moderate mucosal erythema and edema  OP moist with mild to moderate posterior cobblestoning, no lesions noted Neck:  Supple, shotty anterior cervical lymphadenopathy  Chest: lungs clear to auscultation bilaterally, normal respiratory effort CV:    RRR, no murmur  Skin:  no rash Psychiatric: appropriate demeanor and responsiveness   Rapid flu: +  ASSESSMENT: Influenza A  PLAN:Outside window for Tamiflu  Symptomatic care with tylenol/motrin for pain or fevers;   Increased fluids as tolerated; humidifier use qhs prn   No work/school until 24 hours fever free  F/u with PCP if worsening, or is not improving    Serafina Royals, FNP 08/31/21 1440

## 2021-09-07 ENCOUNTER — Ambulatory Visit: Payer: PPO

## 2021-10-10 ENCOUNTER — Encounter: Payer: Self-pay | Admitting: Family Medicine

## 2021-10-10 ENCOUNTER — Ambulatory Visit (INDEPENDENT_AMBULATORY_CARE_PROVIDER_SITE_OTHER): Payer: PPO | Admitting: Family Medicine

## 2021-10-10 ENCOUNTER — Other Ambulatory Visit: Payer: Self-pay

## 2021-10-10 VITALS — BP 131/77 | HR 76 | Ht 71.0 in | Wt 146.6 lb

## 2021-10-10 DIAGNOSIS — R7309 Other abnormal glucose: Secondary | ICD-10-CM

## 2021-10-10 DIAGNOSIS — N401 Enlarged prostate with lower urinary tract symptoms: Secondary | ICD-10-CM | POA: Diagnosis not present

## 2021-10-10 DIAGNOSIS — Z0000 Encounter for general adult medical examination without abnormal findings: Secondary | ICD-10-CM | POA: Diagnosis not present

## 2021-10-10 DIAGNOSIS — M19031 Primary osteoarthritis, right wrist: Secondary | ICD-10-CM

## 2021-10-10 DIAGNOSIS — K219 Gastro-esophageal reflux disease without esophagitis: Secondary | ICD-10-CM

## 2021-10-10 DIAGNOSIS — J432 Centrilobular emphysema: Secondary | ICD-10-CM

## 2021-10-10 DIAGNOSIS — R3911 Hesitancy of micturition: Secondary | ICD-10-CM

## 2021-10-10 DIAGNOSIS — Z23 Encounter for immunization: Secondary | ICD-10-CM | POA: Diagnosis not present

## 2021-10-10 DIAGNOSIS — N529 Male erectile dysfunction, unspecified: Secondary | ICD-10-CM | POA: Diagnosis not present

## 2021-10-10 MED ORDER — BREO ELLIPTA 100-25 MCG/ACT IN AEPB: 1.0000 | INHALATION_SPRAY | Freq: Every day | RESPIRATORY_TRACT | 11 refills | Status: DC

## 2021-10-10 MED ORDER — TADALAFIL 20 MG PO TABS: 20.0000 mg | ORAL_TABLET | ORAL | 5 refills | Status: DC | PRN

## 2021-10-10 MED ORDER — OMEPRAZOLE 40 MG PO CPDR
40.0000 mg | DELAYED_RELEASE_CAPSULE | Freq: Every day | ORAL | 3 refills | Status: DC
Start: 2021-10-10 — End: 2022-10-08

## 2021-10-10 NOTE — Progress Notes (Signed)
Subjective:    Patient ID: James [name], male    DOB: [birth year], 80 y.o.   MRN: [id number]  James [name] is a 80 y.o. male presenting on 10/10/2021 for Annual Exam   HPI  He had some L groin pain that has since resolved    BPH LUTS He takes Doxazosin 2mg  x 2 = 4mg  daily in AM. He has difficulty starting urination.  Inguinal Hernia S/p mesh repair years ago 10-15+ He admits some deeper inguinal pressure He has episodic pain with inc activity  Constipation He admits problem with constipation. He can go up to 5-6 days without BM. Straining constipation.   Centrilobular Emphysema Tobacco Abuse Active smoker, quarter pack per day 6-7 per day He can do what he needs to do daily activities On Breo inhaler daily, Albuterol PRN Needs refill Breo  Chronic Hearing Loss, sensorineural   GERD Controlled on Omeprazole 40mg  daily   History of Alzheimer's Dementia Previously on Donepezil 10mg  but says did not help. Has come off med.   Osteoarthritis R Wrist and Thumb Right Hand/Wrist Pain History of wear and tear to R hand wrist playing [employer] He has seen Orthopedic in past 6-7 months in [place], he was seen by [facility] potentially about 3 years ago has had prior injection limited success. Request return now.  Since last visit 07/2021 he was referred to [facility], and they did a cortisone injection for Carpal Tunnel. They also offered procedure but he declined for now.  Health Maintenance: UTD Flu Shot, he did have the Flu Virus.  Depression screen PHQ 2/9 08/08/2021  Decreased Interest 0  Down, Depressed, Hopeless 0  PHQ - 2 Score 0    Past Medical History:  Diagnosis Date   Arthritis    Asthma    Baker's cyst of knee, right    BPH (benign prostatic hyperplasia)    Bronchitis    Carotid stenosis, left    Carpal tunnel syndrome, right    COPD (chronic obstructive pulmonary disease) (HCC)    Dysphagia    Elevated cholesterol     Erectile dysfunction    Gastritis and duodenitis    GERD (gastroesophageal reflux disease)    Neuropathy of right forearm    Schatzki's ring    Tendinitis    Vertigo, intermittent    Weight loss    Past Surgical History:  Procedure Laterality Date   BALLOON DILATION N/A 12/17/2018   Procedure: BALLOON DILATION;  Surgeon: Manya Silvas, MD;  Location: [facility] ENDOSCOPY;  Service: Endoscopy;  Laterality: N/A;   COLONOSCOPY WITH ESOPHAGOGASTRODUODENOSCOPY (EGD) AND ESOPHAGEAL DILATION (ED)     ESOPHAGOGASTRODUODENOSCOPY (EGD) WITH PROPOFOL N/A 10/26/2016   Procedure: ESOPHAGOGASTRODUODENOSCOPY (EGD) WITH PROPOFOL;  Surgeon: Manya Silvas, MD;  Location: [facility] ENDOSCOPY;  Service: Endoscopy;  Laterality: N/A;   ESOPHAGOGASTRODUODENOSCOPY (EGD) WITH PROPOFOL N/A 12/17/2018   Procedure: ESOPHAGOGASTRODUODENOSCOPY (EGD) WITH PROPOFOL;  Surgeon: Manya Silvas, MD;  Location: [facility] ENDOSCOPY;  Service: Endoscopy;  Laterality: N/A;   HERNIA REPAIR     SAVORY DILATION N/A 10/26/2016   Procedure: SAVORY DILATION;  Surgeon: Manya Silvas, MD;  Location: [facility] ENDOSCOPY;  Service: Endoscopy;  Laterality: N/A;   Social History   Socioeconomic History   Marital status: Married    Spouse name: Not on file   Number of children: Not on file   Years of education: Not on file   Highest education level: Not on file  Occupational History   Not on  file  Tobacco Use   Smoking status: Every Day    Packs/day: 0.25    Years: 50.00    Pack years: 12.50    Types: Cigarettes   Smokeless tobacco: Never  Vaping Use   Vaping Use: Never used  Substance and Sexual Activity   Alcohol use: Yes    Alcohol/week: 3.0 standard drinks    Types: 3 Cans of beer per week   Drug use: No   Sexual activity: Not on file  Other Topics Concern   Not on file  Social History Narrative   Not on file   Social Determinants of Health   Financial Resource Strain: Not on file  Food Insecurity: Not on file   Transportation Needs: Not on file  Physical Activity: Not on file  Stress: Not on file  Social Connections: Not on file  Intimate Partner Violence: Not on file   History reviewed. No pertinent family history. Current Outpatient Medications on File Prior to Visit  Medication Sig   meclizine (ANTIVERT) 25 MG tablet Take 1 tablet (25 mg total) by mouth 3 (three) times daily as needed for dizziness.   Multiple Vitamin (MULTIVITAMIN WITH MINERALS) TABS tablet Take 1 tablet by mouth daily. Centrum.   naproxen sodium (ANAPROX) 220 MG tablet Take 220-440 mg by mouth 2 (two) times daily as needed (for pain.).   PROAIR HFA 108 (90 Base) MCG/ACT inhaler Inhale 1-2 puffs into the lungs every 6 (six) hours as needed. For shortness of breath   doxazosin (CARDURA) 2 MG tablet Take 2 tablets (4 mg total) by mouth daily.   No current facility-administered medications on file prior to visit.    Review of Systems  Constitutional:  Negative for activity change, appetite change, chills, diaphoresis, fatigue and fever.  HENT:  Negative for congestion and hearing loss.   Eyes:  Negative for visual disturbance.  Respiratory:  Negative for cough, chest tightness, shortness of breath and wheezing.   Cardiovascular:  Negative for chest pain, palpitations and leg swelling.  Gastrointestinal:  Negative for abdominal pain, constipation, diarrhea, nausea and vomiting.  Genitourinary:  Negative for dysuria, frequency and hematuria.  Musculoskeletal:  Negative for arthralgias and neck pain.  Skin:  Negative for rash.  Neurological:  Negative for dizziness, weakness, light-headedness, numbness and headaches.  Hematological:  Negative for adenopathy.  Psychiatric/Behavioral:  Negative for behavioral problems, dysphoric mood and sleep disturbance.   Per HPI unless specifically indicated above      Objective:    BP 131/77    Pulse 76    Ht 5\' 11"  (1.803 m)    Wt 146 lb 9.6 oz (66.5 kg)    SpO2 99%    BMI 20.45  kg/m   Wt Readings from Last 3 Encounters:  10/10/21 146 lb 9.6 oz (66.5 kg)  08/08/21 144 lb (65.3 kg)  12/17/18 155 lb (70.3 kg)    Physical Exam Vitals and nursing note reviewed.  Constitutional:      General: He is not in acute distress.    Appearance: He is well-developed. He is not diaphoretic.     Comments: Well-appearing, comfortable, cooperative  HENT:     Head: Normocephalic and atraumatic.  Eyes:     General:        Right eye: No discharge.        Left eye: No discharge.     Conjunctiva/sclera: Conjunctivae normal.     Pupils: Pupils are equal, round, and reactive to light.  Neck:  Thyroid: No thyromegaly.  Cardiovascular:     Rate and Rhythm: Normal rate and regular rhythm.     Pulses: Normal pulses.     Heart sounds: Normal heart sounds. No murmur heard. Pulmonary:     Effort: Pulmonary effort is normal. No respiratory distress.     Breath sounds: Normal breath sounds. No wheezing or rales.  Abdominal:     General: Bowel sounds are normal. There is no distension.     Palpations: Abdomen is soft. There is no mass.     Tenderness: There is no abdominal tenderness.  Musculoskeletal:        General: No tenderness. Normal range of motion.     Cervical back: Normal range of motion and neck supple.     Comments: Upper / Lower Extremities: - Normal muscle tone, strength bilateral upper extremities 5/5, lower extremities 5/5  Lymphadenopathy:     Cervical: No cervical adenopathy.  Skin:    General: Skin is warm and dry.     Findings: No erythema or rash.  Neurological:     Mental Status: He is alert and oriented to person, place, and time.     Comments: Distal sensation intact to light touch all extremities  Psychiatric:        Mood and Affect: Mood normal.        Behavior: Behavior normal.        Thought Content: Thought content normal.     Comments: Well groomed, good eye contact, normal speech and thoughts   Results for orders placed or performed during  the hospital encounter of 08/31/21  POCT Influenza A/B  Result Value Ref Range   Influenza A, POC Positive (A) Negative   Influenza B, POC Negative Negative      Assessment & Plan:   Problem List Items Addressed This Visit     Gastroesophageal reflux disease without esophagitis   Relevant Medications   omeprazole (PRILOSEC) 40 MG capsule   Centrilobular emphysema (HCC)   Relevant Medications   BREO ELLIPTA 100-25 MCG/ACT AEPB   Benign prostatic hyperplasia with urinary hesitancy   Relevant Medications   doxazosin (CARDURA) 2 MG tablet   Other Relevant Orders   PSA   Other Visit Diagnoses     Annual physical exam    -  Primary   Relevant Orders   COMPLETE METABOLIC PANEL WITH GFR   Lipid panel   CBC with Differential/Platelet   Hemoglobin A1c   PSA   Need for vaccination for Strep pneumoniae       Relevant Orders   Pneumococcal conjugate vaccine 20-valent (Completed)   Primary osteoarthritis of right wrist       Relevant Orders   COMPLETE METABOLIC PANEL WITH GFR   CBC with Differential/Platelet   Abnormal glucose       Relevant Orders   Hemoglobin A1c   Erectile dysfunction, unspecified erectile dysfunction type       Relevant Medications   tadalafil (CIALIS) 20 MG tablet       Updated Health Maintenance information Flu Shot already updated. Also s/p flu PCV20 today Labs fasting in 2 days. Encouraged improvement to lifestyle with diet and exercise Goal of weight loss  Centrilobular Emphysema Controlled Re order Breo  GERD Controlled re order PPI  R Wrist OA/DJD Keep up with Orthopedics, may return for inj or procedure. May use topical PRN and support splints  ED Rx Tadalafil printed, generic option 30 pills 20mg  every other day PRN Goodrx coupon  Meds  ordered this encounter  Medications   omeprazole (PRILOSEC) 40 MG capsule    Sig: Take 1 capsule (40 mg total) by mouth daily.    Dispense:  90 capsule    Refill:  3   BREO ELLIPTA 100-25  MCG/ACT AEPB    Sig: Inhale 1 puff into the lungs daily.    Dispense:  60 each    Refill:  11   tadalafil (CIALIS) 20 MG tablet    Sig: Take 1 tablet (20 mg total) by mouth every other day as needed for erectile dysfunction.    Dispense:  30 tablet    Refill:  5       Follow up plan: Return in about 2 days (around 10/12/2021) for 2 days Thursday 12/15 at 815am Lab only - 6 month follow-up COPD, GERD, OA.  Nobie Putnam, [facility] 10/10/2021, 1:24 PM

## 2021-10-10 NOTE — Patient Instructions (Addendum)
Thank you for coming to the office today.  Refilled Breo, and Omeprazole today  Tadalafil (generic Cialis) - printed take to [place] any one you like to get discount, sign up for Goodrx.com to get better price  Call back when ready for the Doxazosin  Pneumonia vaccine today.  For Constipation (less frequent bowel movement that can be hard dry or involve straining).  Recommend trying OTC Miralax 17g = 1 capful in large glass water once daily for now, try several days to see if working, goal is soft stool or BM 1-2 times daily, if too loose then reduce dose or try every other day. If not effective may need to increase it to 2 doses at once in AM or may do 1 in morning and 1 in afternoon/evening  - This medicine is very safe and can be used often without any problem and will not make you dehydrated. It is good for use on AS NEEDED BASIS or even MAINTENANCE therapy for longer term for several days to weeks at a time to help regulate bowel movements  Other more natural remedies or preventative treatment: - Increase hydration with water - Increase fiber in diet (high fiber foods = vegetables, leafy greens, oats/grains) - May take OTC Fiber supplement (metamucil powder or pill/gummy) - May try OTC Probiotic  If not improving the groin / hernia pain we can refer next.   DUE for FASTING BLOOD WORK (no food or drink after midnight before the lab appointment, only water or coffee without cream/sugar on the morning of)  SCHEDULE "Lab Only" visit in the morning at the clinic for lab draw in 2 days  - Make sure Lab Only appointment is at about 1 week before your next appointment, so that results will be available  For Lab Results, once available within 2-3 days of blood draw, you can can log in to MyChart online to view your results and a brief explanation. Also, we can discuss results at next follow-up visit.   Please schedule a Follow-up Appointment to: Return in about 2 days (around  10/12/2021) for 2 days Thursday 12/15 at 815am Lab only - 6 month follow-up COPD, GERD, OA.  If you have any other questions or concerns, please feel free to call the office or send a message through [place]. You may also schedule an earlier appointment if necessary.  Additionally, you may be receiving a survey about your experience at our office within a few days to 1 week by e-mail or mail. We value your feedback.  Nobie Putnam, DO [place]

## 2021-10-12 ENCOUNTER — Other Ambulatory Visit: Payer: Self-pay

## 2021-10-12 DIAGNOSIS — R3911 Hesitancy of micturition: Secondary | ICD-10-CM | POA: Diagnosis not present

## 2021-10-12 DIAGNOSIS — M19031 Primary osteoarthritis, right wrist: Secondary | ICD-10-CM | POA: Diagnosis not present

## 2021-10-12 DIAGNOSIS — R7309 Other abnormal glucose: Secondary | ICD-10-CM

## 2021-10-12 DIAGNOSIS — N401 Enlarged prostate with lower urinary tract symptoms: Secondary | ICD-10-CM

## 2021-10-12 DIAGNOSIS — Z0000 Encounter for general adult medical examination without abnormal findings: Secondary | ICD-10-CM

## 2021-10-12 DIAGNOSIS — Z13220 Encounter for screening for lipoid disorders: Secondary | ICD-10-CM | POA: Diagnosis not present

## 2021-10-13 LAB — COMPLETE METABOLIC PANEL WITH GFR
AG Ratio: 2 (calc) (ref 1.0–2.5)
ALT: 16 U/L (ref 9–46)
AST: 23 U/L (ref 10–35)
Albumin: 4.1 g/dL (ref 3.6–5.1)
Alkaline phosphatase (APISO): 73 U/L (ref 35–144)
BUN: 21 mg/dL (ref 7–25)
CO2: 26 mmol/L (ref 20–32)
Calcium: 9.2 mg/dL (ref 8.6–10.3)
Chloride: 108 mmol/L (ref 98–110)
Creat: 1.18 mg/dL (ref 0.70–1.28)
Globulin: 2.1 g/dL (calc) (ref 1.9–3.7)
Glucose, Bld: 94 mg/dL (ref 65–139)
Potassium: 4.6 mmol/L (ref 3.5–5.3)
Sodium: 141 mmol/L (ref 135–146)
Total Bilirubin: 0.8 mg/dL (ref 0.2–1.2)
Total Protein: 6.2 g/dL (ref 6.1–8.1)
eGFR: 63 mL/min/{1.73_m2} (ref >=60)

## 2021-10-13 LAB — CBC WITH DIFFERENTIAL/PLATELET
Absolute Monocytes: 429 cells/uL (ref 200–950)
Basophils Absolute: 29 cells/uL (ref 0–200)
Basophils Relative: 0.5 %
Eosinophils Absolute: 354 cells/uL (ref 15–500)
Eosinophils Relative: 6.1 %
HCT: 46.2 % (ref 38.5–50.0)
Hemoglobin: 15.3 g/dL (ref 13.2–17.1)
Lymphs Abs: 1404 cells/uL (ref 850–3900)
MCH: 30 pg (ref 27.0–33.0)
MCHC: 33.1 g/dL (ref 32.0–36.0)
MCV: 90.6 fL (ref 80.0–100.0)
MPV: 11.9 fL (ref 7.5–12.5)
Monocytes Relative: 7.4 %
Neutro Abs: 3584 cells/uL (ref 1500–7800)
Neutrophils Relative %: 61.8 %
Platelets: 263 10*3/uL (ref 140–400)
RBC: 5.1 10*6/uL (ref 4.20–5.80)
RDW: 13.3 % (ref 11.0–15.0)
Total Lymphocyte: 24.2 %
WBC: 5.8 10*3/uL (ref 3.8–10.8)

## 2021-10-13 LAB — LIPID PANEL
Cholesterol: 169 mg/dL (ref <200)
HDL: 43 mg/dL (ref >=40)
LDL Cholesterol (Calc): 111 mg/dL (calc) — ABNORMAL HIGH
Non-HDL Cholesterol (Calc): 126 mg/dL (calc) (ref <130)
Total CHOL/HDL Ratio: 3.9 (calc) (ref <5.0)
Triglycerides: 63 mg/dL (ref <150)

## 2021-10-13 LAB — PSA: PSA: 0.52 ng/mL (ref <=4.00)

## 2021-10-13 LAB — HEMOGLOBIN A1C
Hgb A1c MFr Bld: 5.4 % of total Hgb (ref <5.7)
Mean Plasma Glucose: 108 mg/dL
eAG (mmol/L): 6 mmol/L

## 2021-10-19 ENCOUNTER — Encounter: Payer: Self-pay | Admitting: Family Medicine

## 2021-10-19 DIAGNOSIS — E7800 Pure hypercholesterolemia, unspecified: Secondary | ICD-10-CM | POA: Insufficient documentation

## 2021-11-28 DIAGNOSIS — L570 Actinic keratosis: Secondary | ICD-10-CM | POA: Diagnosis not present

## 2021-11-28 DIAGNOSIS — Z85828 Personal history of other malignant neoplasm of skin: Secondary | ICD-10-CM | POA: Diagnosis not present

## 2021-11-28 DIAGNOSIS — D049 Carcinoma in situ of skin, unspecified: Secondary | ICD-10-CM | POA: Diagnosis not present

## 2021-11-28 DIAGNOSIS — Z08 Encounter for follow-up examination after completed treatment for malignant neoplasm: Secondary | ICD-10-CM | POA: Diagnosis not present

## 2021-11-29 ENCOUNTER — Encounter: Payer: Self-pay | Admitting: Family Medicine

## 2021-11-29 ENCOUNTER — Ambulatory Visit (INDEPENDENT_AMBULATORY_CARE_PROVIDER_SITE_OTHER): Payer: PPO | Admitting: Family Medicine

## 2021-11-29 ENCOUNTER — Telehealth: Payer: Self-pay | Admitting: Family Medicine

## 2021-11-29 ENCOUNTER — Other Ambulatory Visit: Payer: Self-pay

## 2021-11-29 VITALS — BP 124/61 | HR 74 | Ht 71.0 in | Wt 140.2 lb

## 2021-11-29 DIAGNOSIS — J441 Chronic obstructive pulmonary disease with (acute) exacerbation: Secondary | ICD-10-CM | POA: Diagnosis not present

## 2021-11-29 DIAGNOSIS — J310 Chronic rhinitis: Secondary | ICD-10-CM | POA: Diagnosis not present

## 2021-11-29 DIAGNOSIS — R630 Anorexia: Secondary | ICD-10-CM | POA: Diagnosis not present

## 2021-11-29 DIAGNOSIS — Z720 Tobacco use: Secondary | ICD-10-CM | POA: Diagnosis not present

## 2021-11-29 DIAGNOSIS — R634 Abnormal weight loss: Secondary | ICD-10-CM | POA: Diagnosis not present

## 2021-11-29 DIAGNOSIS — J432 Centrilobular emphysema: Secondary | ICD-10-CM

## 2021-11-29 MED ORDER — PREDNISONE 20 MG PO TABS: ORAL_TABLET | ORAL | 0 refills | Status: DC

## 2021-11-29 MED ORDER — LEVOFLOXACIN 500 MG PO TABS: 500.0000 mg | ORAL_TABLET | Freq: Every day | ORAL | 0 refills | Status: DC

## 2021-11-29 MED ORDER — FLUTICASONE PROPIONATE 50 MCG/ACT NA SUSP: 2.0000 | Freq: Every day | NASAL | 3 refills | Status: AC

## 2021-11-29 NOTE — Patient Instructions (Addendum)
Thank you for coming to the office today.  Referral to Lung Doctors for [facility] the CT Scan of Chest / Lungs looking at lungs for lung cancer and also will include images of the spine and rest of chest. Stay tuned they will call you in 2-3 weeks if not heard back call us.  [place] [address], [place], [place] Phone: [phone]  --------------------  Use up your Elmo, then try the sample Breztri new inhaler today, 2 puffs twice a day for 7 days on free sample, if it works good then we can try to order it, I am not sure how much it costs.  ----------  For cough and lungs Likely COPD flare from recent viral infection  Start nasal steroid Flonase 2 sprays in each nostril daily for 4-6 weeks, may repeat course seasonally or as needed  Start taking Levaquin antibiotic 500mg  daily x 7 days  Start Prednisone taper over 7 days, this can help breathing and also help stimulate appetite  Appetite is reduced due to the mucus/coughing all of these things interfere with appetite and taste, also smoking can reduce taste as well.  Try to add Ensure or Boost for protein supplement.   Please schedule a Follow-up Appointment to: Return in about 3 months (around 02/26/2022) for 3 month follow COPD / Appetite wt loss, Smoking.  If you have any other questions or concerns, please feel free to call the office or send a message through [place]. You may also schedule an earlier appointment if necessary.  Additionally, you may be receiving a survey about your experience at our office within a few days to 1 week by e-mail or mail. We value your feedback.  Nobie Putnam, DO [place]

## 2021-11-29 NOTE — Telephone Encounter (Signed)
Copied from [place] [id number]. Topic: General - Other >> Nov 29, 2021  8:08 AM Leward Quan A wrote: Reason for CRM: FYI: Patient daughter Catarina Hartshorn called in to inform Dr Raliegh Ip when patient come in for his visit today he may not remember to tell Dr that he have no appetite, complains of pain around groin area ( stated that he picked up something in the pharmacy OTC for his prostate), has bad cough with dark mucus, c/o lower back pain say that patient want an xray on lower back and chest, has memory issues and problems urinating. Lelon Frohlich is asking if Dr Raliegh Ip can look into these things but not to let patient know that she called in because he think she's trying to control his life.

## 2021-11-29 NOTE — Progress Notes (Signed)
Subjective:    Patient ID: James [name], male    DOB: 09-Feb-1941, 81 y.o.   MRN: [id number]  James [name] is a 81 y.o. male presenting on 11/29/2021 for Weight Loss and Anorexia   HPI  Cough, persistent Centrilobular Emphysema Tobacco Abuse For 1 month has had recurrent persistent cough, some post nasal drainage, seems to have productive cough mostly brownish, worse at night. He has Breo daily, says may not be very effective. Unsure. Does not have albuterol inhaler He asks about CT imaging in future for lungs and or spine/back.  Reduced Appetite He eats a big breakfast in morning, he will eat sausage, bacon, eggs, toast. He says not getting hungry later in afternoon    Depression screen [facility] 2/9 11/29/2021 08/08/2021  Decreased Interest 0 0  Down, Depressed, Hopeless 0 0  PHQ - 2 Score 0 0    Social History   Tobacco Use   Smoking status: Every Day    Packs/day: 0.25    Years: 50.00    Pack years: 12.50    Types: Cigarettes   Smokeless tobacco: Never  Vaping Use   Vaping Use: Never used  Substance Use Topics   Alcohol use: Yes    Alcohol/week: 3.0 standard drinks    Types: 3 Cans of beer per week   Drug use: No    Review of Systems Per HPI unless specifically indicated above     Objective:    BP 124/61    Pulse 74    Ht 5' 11"  (1.803 m)    Wt 140 lb 3.2 oz (63.6 kg)    SpO2 98%    BMI 19.55 kg/m   Wt Readings from Last 3 Encounters:  11/29/21 140 lb 3.2 oz (63.6 kg)  10/10/21 146 lb 9.6 oz (66.5 kg)  08/08/21 144 lb (65.3 kg)    Physical Exam Vitals and nursing note reviewed.  Constitutional:      General: He is not in acute distress.    Appearance: He is well-developed. He is not diaphoretic.     Comments: Well-appearing elderly 81 year old male, slightly thin, comfortable, cooperative  HENT:     Head: Normocephalic and atraumatic.  Eyes:     General:        Right eye: No discharge.        Left eye: No discharge.     Conjunctiva/sclera:  Conjunctivae normal.  Neck:     Thyroid: No thyromegaly.  Cardiovascular:     Rate and Rhythm: Normal rate and regular rhythm.     Pulses: Normal pulses.     Heart sounds: Normal heart sounds. No murmur heard. Pulmonary:     Effort: Pulmonary effort is normal. No respiratory distress.     Breath sounds: Wheezing present. No rales.  Musculoskeletal:        General: Normal range of motion.     Cervical back: Normal range of motion and neck supple.     Right lower leg: No edema.     Left lower leg: No edema.  Lymphadenopathy:     Cervical: No cervical adenopathy.  Skin:    General: Skin is warm and dry.     Findings: No erythema or rash.  Neurological:     Mental Status: He is alert and oriented to person, place, and time. Mental status is at baseline.  Psychiatric:        Behavior: Behavior normal.     Comments: Well groomed, good eye contact,  normal speech and thoughts     Results for orders placed or performed in visit on 10/12/21  PSA  Result Value Ref Range   PSA 0.52 < OR = 4.00 ng/mL  Hemoglobin A1c  Result Value Ref Range   Hgb A1c MFr Bld 5.4 <5.7 % of total Hgb   Mean Plasma Glucose 108 mg/dL   eAG (mmol/L) 6.0 mmol/L  CBC with Differential/Platelet  Result Value Ref Range   WBC 5.8 3.8 - 10.8 Thousand/uL   RBC 5.10 4.20 - 5.80 Million/uL   Hemoglobin 15.3 13.2 - 17.1 g/dL   HCT 46.2 38.5 - 50.0 %   MCV 90.6 80.0 - 100.0 fL   MCH 30.0 27.0 - 33.0 pg   MCHC 33.1 32.0 - 36.0 g/dL   RDW 13.3 11.0 - 15.0 %   Platelets 263 140 - 400 Thousand/uL   MPV 11.9 7.5 - 12.5 fL   Neutro Abs 3,584 1,500 - 7,800 cells/uL   Lymphs Abs 1,404 850 - 3,900 cells/uL   Absolute Monocytes 429 200 - 950 cells/uL   Eosinophils Absolute 354 15 - 500 cells/uL   Basophils Absolute 29 0 - 200 cells/uL   Neutrophils Relative % 61.8 %   Total Lymphocyte 24.2 %   Monocytes Relative 7.4 %   Eosinophils Relative 6.1 %   Basophils Relative 0.5 %  Lipid panel  Result Value Ref Range    Cholesterol 169 <200 mg/dL   HDL 43 > OR = 40 mg/dL   Triglycerides 63 <150 mg/dL   LDL Cholesterol (Calc) 111 (H) mg/dL (calc)   Total CHOL/HDL Ratio 3.9 <5.0 (calc)   Non-HDL Cholesterol (Calc) 126 <130 mg/dL (calc)  COMPLETE METABOLIC PANEL WITH GFR  Result Value Ref Range   Glucose, Bld 94 65 - 139 mg/dL   BUN 21 7 - 25 mg/dL   Creat 1.18 0.70 - 1.28 mg/dL   eGFR 63 > OR = 60 mL/min/1.84m   BUN/Creatinine Ratio NOT APPLICABLE 6 - 22 (calc)   Sodium 141 135 - 146 mmol/L   Potassium 4.6 3.5 - 5.3 mmol/L   Chloride 108 98 - 110 mmol/L   CO2 26 20 - 32 mmol/L   Calcium 9.2 8.6 - 10.3 mg/dL   Total Protein 6.2 6.1 - 8.1 g/dL   Albumin 4.1 3.6 - 5.1 g/dL   Globulin 2.1 1.9 - 3.7 g/dL (calc)   AG Ratio 2.0 1.0 - 2.5 (calc)   Total Bilirubin 0.8 0.2 - 1.2 mg/dL   Alkaline phosphatase (APISO) 73 35 - 144 U/L   AST 23 10 - 35 U/L   ALT 16 9 - 46 U/L      Assessment & Plan:   Problem List Items Addressed This Visit     Centrilobular emphysema (HCC) - Primary   Relevant Medications   fluticasone (FLONASE) 50 MCG/ACT nasal spray   predniSONE (DELTASONE) 20 MG tablet   Other Relevant Orders   Ambulatory Referral Lung Cancer Screening [facility]   Other Visit Diagnoses     Tobacco abuse       Relevant Orders   Ambulatory Referral Lung Cancer Screening [facility]   Loss of appetite       Weight loss       Other rhinitis       Relevant Medications   fluticasone (FLONASE) 50 MCG/ACT nasal spray   COPD with acute exacerbation (HCC)       Relevant Medications   fluticasone (FLONASE) 50 MCG/ACT nasal spray  levofloxacin (LEVAQUIN) 500 MG tablet   predniSONE (DELTASONE) 20 MG tablet       COPD w/ acute exacerbatoin Emphysema Tobacco Abuse Post nasal drainage / post viral cough Reduced Appetite wt loss  Will cover for AECOPD mild flare with prolonged cough after viral infection, now seems COPD related. Start taking Levaquin antibiotic 5101m daily x 7  days Prednisone 7 day taper  Breztri Sample today, see if he can switch off of Breo if beneficial. We can order once we hear back from him.  Discussed appetite reduction wt loss likely from smoking and cough drainage Appetite is reduced due to the mucus/coughing all of these things interfere with appetite and taste, also smoking can reduce taste as well. Try to add Ensure or Boost for protein supplement. Labs looked good reviewed 09/2021  [facility].  Meds ordered this encounter  Medications   fluticasone (FLONASE) 50 MCG/ACT nasal spray    Sig: Place 2 sprays into both nostrils daily. Use for 4-6 weeks then stop and use seasonally or as needed.    Dispense:  16 g    Refill:  3   levofloxacin (LEVAQUIN) 500 MG tablet    Sig: Take 1 tablet (500 mg total) by mouth daily. For 7 days    Dispense:  7 tablet    Refill:  0   predniSONE (DELTASONE) 20 MG tablet    Sig: Take daily with food. Start with 617m(3 pills) x 2 days, then reduce to 4028m2 pills) x 2 days, then 91m68m pill) x 3 days    Dispense:  13 tablet    Refill:  0      Follow up plan: Return in about 3 months (around 02/26/2022) for 3 month follow COPD / Appetite wt loss, Smoking.   AlexNobie Putnam [facility] 11/29/2021, 9:03 AM

## 2021-11-29 NOTE — Telephone Encounter (Signed)
Patient is seeing Dr. Raliegh Ip this morning.

## 2021-12-20 DIAGNOSIS — L244 Irritant contact dermatitis due to drugs in contact with skin: Secondary | ICD-10-CM | POA: Diagnosis not present

## 2021-12-29 ENCOUNTER — Ambulatory Visit (INDEPENDENT_AMBULATORY_CARE_PROVIDER_SITE_OTHER): Payer: PPO

## 2021-12-29 VITALS — Ht 70.0 in | Wt 140.0 lb

## 2021-12-29 DIAGNOSIS — J45909 Unspecified asthma, uncomplicated: Secondary | ICD-10-CM | POA: Insufficient documentation

## 2021-12-29 DIAGNOSIS — Z0000 Encounter for general adult medical examination without abnormal findings: Secondary | ICD-10-CM

## 2021-12-29 DIAGNOSIS — M545 Low back pain, unspecified: Secondary | ICD-10-CM | POA: Insufficient documentation

## 2021-12-29 DIAGNOSIS — K222 Esophageal obstruction: Secondary | ICD-10-CM | POA: Insufficient documentation

## 2021-12-29 NOTE — Patient Instructions (Signed)
James [name] , Thank you for taking time to come for your Medicare Wellness Visit. I appreciate your ongoing commitment to your health goals. Please review the following plan we discussed and let me know if I can assist you in the future.   Screening recommendations/referrals: Colonoscopy: aged out Recommended yearly ophthalmology/optometry visit for glaucoma screening and checkup Recommended yearly dental visit for hygiene and checkup  Vaccinations: Influenza vaccine: 08/08/21 Pneumococcal vaccine: 10/10/21 Tdap vaccine: 12/10/17 Shingles vaccine: n/d   Covid-19: n/d  Advanced directives: yes, requested copy of Medical Power of Attorney once signed  Conditions/risks identified: no  Next appointment: Follow up in one year for your annual wellness visit. 01/04/23 @ 2:40pm by phone  Preventive Care 79 Years and Older, Male Preventive care refers to lifestyle choices and visits with your health care provider that can promote health and wellness. What does preventive care include? A yearly physical exam. This is also called an annual well check. Dental exams once or twice a year. Routine eye exams. Ask your health care provider how often you should have your eyes checked. Personal lifestyle choices, including: Daily care of your teeth and gums. Regular physical activity. Eating a healthy diet. Avoiding tobacco and drug use. Limiting alcohol use. Practicing safe sex. Taking low doses of aspirin every day. Taking vitamin and mineral supplements as recommended by your health care provider. What happens during an annual well check? The services and screenings done by your health care provider during your annual well check will depend on your age, overall health, lifestyle risk factors, and family history of disease. Counseling  Your health care provider may ask you questions about your: Alcohol use. Tobacco use. Drug use. Emotional well-being. Home and relationship well-being. Sexual  activity. Eating habits. History of falls. Memory and ability to understand (cognition). Work and work Statistician. Screening  You may have the following tests or measurements: Height, weight, and BMI. Blood pressure. Lipid and cholesterol levels. These may be checked every 5 years, or more frequently if you are over 54 years old. Skin check. Lung cancer screening. You may have this screening every year starting at age 79 if you have a 30-pack-year history of smoking and currently smoke or have quit within the past 15 years. Fecal occult blood test (FOBT) of the stool. You may have this test every year starting at age 79. Flexible sigmoidoscopy or colonoscopy. You may have a sigmoidoscopy every 5 years or a colonoscopy every 10 years starting at age 79. Prostate cancer screening. Recommendations will vary depending on your family history and other risks. Hepatitis C blood test. Hepatitis B blood test. Sexually transmitted disease (STD) testing. Diabetes screening. This is done by checking your blood sugar (glucose) after you have not eaten for a while (fasting). You may have this done every 1-3 years. Abdominal aortic aneurysm (AAA) screening. You may need this if you are a current or former smoker. Osteoporosis. You may be screened starting at age 52 if you are at high risk. Talk with your health care provider about your test results, treatment options, and if necessary, the need for more tests. Vaccines  Your health care provider may recommend certain vaccines, such as: Influenza vaccine. This is recommended every year. Tetanus, diphtheria, and acellular pertussis (Tdap, Td) vaccine. You may need a Td booster every 10 years. Zoster vaccine. You may need this after age 36. Pneumococcal 13-valent conjugate (PCV13) vaccine. One dose is recommended after age 76. Pneumococcal polysaccharide (PPSV23) vaccine. One dose is recommended after  age 43. Talk to your health care provider about which  screenings and vaccines you need and how often you need them. This information is not intended to replace advice given to you by your health care provider. Make sure you discuss any questions you have with your health care provider. Document Released: 11/11/2015 Document Revised: 07/04/2016 Document Reviewed: 08/16/2015 Elsevier Interactive Patient Education  2017 [employer] Prevention in the Home Falls can cause injuries. They can happen to people of all ages. There are many things you can do to make your home safe and to help prevent falls. What can I do on the outside of my home? Regularly fix the edges of walkways and driveways and fix any cracks. Remove anything that might make you trip as you walk through a door, such as a raised step or threshold. Trim any bushes or trees on the path to your home. Use bright outdoor lighting. Clear any walking paths of anything that might make someone trip, such as rocks or tools. Regularly check to see if handrails are loose or broken. Make sure that both sides of any steps have handrails. Any raised decks and porches should have guardrails on the edges. Have any leaves, snow, or ice cleared regularly. Use sand or salt on walking paths during winter. Clean up any spills in your garage right away. This includes oil or grease spills. What can I do in the bathroom? Use night lights. Install grab bars by the toilet and in the tub and shower. Do not use towel bars as grab bars. Use non-skid mats or decals in the tub or shower. If you need to sit down in the shower, use a plastic, non-slip stool. Keep the floor dry. Clean up any water that spills on the floor as soon as it happens. Remove soap buildup in the tub or shower regularly. Attach bath mats securely with double-sided non-slip rug tape. Do not have throw rugs and other things on the floor that can make you trip. What can I do in the bedroom? Use night lights. Make sure that you have a  light by your bed that is easy to reach. Do not use any sheets or blankets that are too big for your bed. They should not hang down onto the floor. Have a firm chair that has side arms. You can use this for support while you get dressed. Do not have throw rugs and other things on the floor that can make you trip. What can I do in the kitchen? Clean up any spills right away. Avoid walking on wet floors. Keep items that you use a lot in easy-to-reach places. If you need to reach something above you, use a strong step stool that has a grab bar. Keep electrical cords out of the way. Do not use floor polish or wax that makes floors slippery. If you must use wax, use non-skid floor wax. Do not have throw rugs and other things on the floor that can make you trip. What can I do with my stairs? Do not leave any items on the stairs. Make sure that there are handrails on both sides of the stairs and use them. Fix handrails that are broken or loose. Make sure that handrails are as long as the stairways. Check any carpeting to make sure that it is firmly attached to the stairs. Fix any carpet that is loose or worn. Avoid having throw rugs at the top or bottom of the stairs. If you do  have throw rugs, attach them to the floor with carpet tape. Make sure that you have a light switch at the top of the stairs and the bottom of the stairs. If you do not have them, ask someone to add them for you. What else can I do to help prevent falls? Wear shoes that: Do not have high heels. Have rubber bottoms. Are comfortable and fit you well. Are closed at the toe. Do not wear sandals. If you use a stepladder: Make sure that it is fully opened. Do not climb a closed stepladder. Make sure that both sides of the stepladder are locked into place. Ask someone to hold it for you, if possible. Clearly mark and make sure that you can see: Any grab bars or handrails. First and last steps. Where the edge of each step  is. Use tools that help you move around (mobility aids) if they are needed. These include: Canes. Walkers. Scooters. Crutches. Turn on the lights when you go into a dark area. Replace any light bulbs as soon as they burn out. Set up your furniture so you have a clear path. Avoid moving your furniture around. If any of your floors are uneven, fix them. If there are any pets around you, be aware of where they are. Review your medicines with your doctor. Some medicines can make you feel dizzy. This can increase your chance of falling. Ask your doctor what other things that you can do to help prevent falls. This information is not intended to replace advice given to you by your health care provider. Make sure you discuss any questions you have with your health care provider. Document Released: 08/11/2009 Document Revised: 03/22/2016 Document Reviewed: 11/19/2014 Elsevier Interactive Patient Education  2017 [employer].

## 2021-12-29 NOTE — Progress Notes (Signed)
Subjective:   James [name] is a 81 y.o. male who presents for Medicare Annual/Subsequent preventive examination.  Review of Systems           Objective:    Today's Vitals   12/29/21 1439  Weight: 140 lb (63.5 kg)  Height: 5\' 10"  (1.778 m)   Body mass index is 20.09 kg/m.  Advanced Directives 12/17/2018 12/12/2018 12/05/2017 01/04/2017  Does Patient Have a Medical Advance Directive? Yes No No No  Type of Paramedic of [place];Living will - - -  Does patient want to make changes to medical advance directive? - - - No - Patient declined  Would patient like information on creating a medical advance directive? - No - Patient declined No - Patient declined No - Patient declined    Current Medications (verified) Outpatient Encounter Medications as of 12/29/2021  Medication Sig   BREO ELLIPTA 100-25 MCG/ACT AEPB Inhale 1 puff into the lungs daily.   diclofenac Sodium (VOLTAREN) 1 % GEL diclofenac 1 % topical gel  APPLY TO AFFECTED AREA 4 TIMES A DAY   doxazosin (CARDURA) 2 MG tablet Take 2 tablets (4 mg total) by mouth daily.   doxazosin (CARDURA) 2 MG tablet doxazosin 2 mg tablet  TAKE 2 TABLETS BY MOUTH EVERY DAY   doxycycline (VIBRAMYCIN) 100 MG capsule doxycycline hyclate 100 mg capsule  TAKE 1 CAPSULE TWICE A DAY BY ORAL ROUTE WITH MEALS FOR 14 DAYS.   fluticasone (FLONASE) 50 MCG/ACT nasal spray Place 2 sprays into both nostrils daily. Use for 4-6 weeks then stop and use seasonally or as needed.   fluticasone furoate-vilanterol (BREO ELLIPTA) 100-25 MCG/ACT AEPB Breo Ellipta 100 mcg-25 mcg/dose powder for inhalation  INHALE 1 PUFF BY MOUTH EVERY DAY   gabapentin (NEURONTIN) 300 MG capsule gabapentin 300 mg capsule   levofloxacin (LEVAQUIN) 500 MG tablet Take 1 tablet (500 mg total) by mouth daily. For 7 days   meclizine (ANTIVERT) 25 MG tablet Take 1 tablet (25 mg total) by mouth 3 (three) times daily as needed for dizziness.   meloxicam (MOBIC) 7.5  MG tablet meloxicam 7.5 mg tablet  TAKE 1 TABLET BY MOUTH EVERY DAY   memantine (NAMENDA) 5 MG tablet memantine 5 mg tablet   methylPREDNISolone (MEDROL DOSEPAK) 4 MG TBPK tablet methylprednisolone 4 mg tablets in a dose pack  TAKE AS DIRECTED   minocycline (DYNACIN) 100 MG tablet minocycline 100 mg tablet   Multiple Vitamin (MULTIVITAMIN WITH MINERALS) TABS tablet Take 1 tablet by mouth daily. Centrum.   naproxen sodium (ANAPROX) 220 MG tablet Take 220-440 mg by mouth 2 (two) times daily as needed (for pain.).   omeprazole (PRILOSEC) 40 MG capsule Take 1 capsule (40 mg total) by mouth daily.   predniSONE (DELTASONE) 20 MG tablet Take daily with food. Start with 60mg  (3 pills) x 2 days, then reduce to 40mg  (2 pills) x 2 days, then 20mg  (1 pill) x 3 days   PROAIR HFA 108 (90 Base) MCG/ACT inhaler Inhale 1-2 puffs into the lungs every 6 (six) hours as needed. For shortness of breath   tadalafil (CIALIS) 20 MG tablet Take 1 tablet (20 mg total) by mouth every other day as needed for erectile dysfunction.   No facility-administered encounter medications on file as of 12/29/2021.    Allergies (verified) Aspirin   History: Past Medical History:  Diagnosis Date   Arthritis    Asthma    Baker's cyst of knee, right    BPH (benign  prostatic hyperplasia)    Bronchitis    Carotid stenosis, left    Carpal tunnel syndrome, right    COPD (chronic obstructive pulmonary disease) (HCC)    Dysphagia    Elevated cholesterol    Erectile dysfunction    Gastritis and duodenitis    GERD (gastroesophageal reflux disease)    Neuropathy of right forearm    Schatzki's ring    Tendinitis    Vertigo, intermittent    Weight loss    Past Surgical History:  Procedure Laterality Date   BALLOON DILATION N/A 12/17/2018   Procedure: BALLOON DILATION;  Surgeon: Manya Silvas, MD;  Location: [facility] ENDOSCOPY;  Service: Endoscopy;  Laterality: N/A;   COLONOSCOPY WITH ESOPHAGOGASTRODUODENOSCOPY (EGD) AND  ESOPHAGEAL DILATION (ED)     ESOPHAGOGASTRODUODENOSCOPY (EGD) WITH PROPOFOL N/A 10/26/2016   Procedure: ESOPHAGOGASTRODUODENOSCOPY (EGD) WITH PROPOFOL;  Surgeon: Manya Silvas, MD;  Location: [facility] ENDOSCOPY;  Service: Endoscopy;  Laterality: N/A;   ESOPHAGOGASTRODUODENOSCOPY (EGD) WITH PROPOFOL N/A 12/17/2018   Procedure: ESOPHAGOGASTRODUODENOSCOPY (EGD) WITH PROPOFOL;  Surgeon: Manya Silvas, MD;  Location: [facility] ENDOSCOPY;  Service: Endoscopy;  Laterality: N/A;   HERNIA REPAIR     SAVORY DILATION N/A 10/26/2016   Procedure: SAVORY DILATION;  Surgeon: Manya Silvas, MD;  Location: [facility] ENDOSCOPY;  Service: Endoscopy;  Laterality: N/A;   No family history on file. Social History   Socioeconomic History   Marital status: Married    Spouse name: Not on file   Number of children: Not on file   Years of education: Not on file   Highest education level: Not on file  Occupational History   Not on file  Tobacco Use   Smoking status: Every Day    Packs/day: 0.25    Years: 50.00    Pack years: 12.50    Types: Cigarettes   Smokeless tobacco: Never  Vaping Use   Vaping Use: Never used  Substance and Sexual Activity   Alcohol use: Yes    Alcohol/week: 3.0 standard drinks    Types: 3 Cans of beer per week   Drug use: No   Sexual activity: Not on file  Other Topics Concern   Not on file  Social History Narrative   Not on file   Social Determinants of Health   Financial Resource Strain: Not on file  Food Insecurity: Not on file  Transportation Needs: Not on file  Physical Activity: Not on file  Stress: Not on file  Social Connections: Not on file    Tobacco Counseling Ready to quit: Not Answered Counseling given: Not Answered   Clinical Intake:  Pre-visit preparation completed: Yes  Pain : No/denies pain     Nutritional Risks: None Diabetes: No  How often do you need to have someone help you when you read instructions, pamphlets, or other written materials  from your doctor or pharmacy?: 1 - Never  Diabetic?NO  Interpreter Needed?: No  Information entered by :: Kirke Shaggy, LPN   Activities of Daily Living No flowsheet data found.  Patient Care Team: Olin Hauser, DO as PCP - General (Family Medicine)  Indicate any recent Medical Services you may have received from other than [facility] providers in the past year (date may be approximate).     Assessment:   This is a routine wellness examination for Danthony.  Hearing/Vision screen No results found.  Dietary issues and exercise activities discussed:     Goals Addressed   None    Depression Screen PHQ 2/9 Scores  11/29/2021 08/08/2021  PHQ - 2 Score 0 0    Fall Risk No flowsheet data found.  FALL RISK PREVENTION PERTAINING TO THE HOME:  Any stairs in or around the home? Yes  If so, are there any without handrails? No  Home free of loose throw rugs in walkways, pet beds, electrical cords, etc? Yes  Adequate lighting in your home to reduce risk of falls? Yes   ASSISTIVE DEVICES UTILIZED TO PREVENT FALLS:  Life alert? No  Use of a cane, walker or w/c? No  Grab bars in the bathroom? No  Shower chair or bench in shower? No  Elevated toilet seat or a handicapped toilet? No    Cognitive Function:SDAT        Immunizations Immunization History  Administered Date(s) Administered   Fluad Quad(high Dose 65+) 08/08/2021   Influenza Split 07/28/2014   Influenza-Unspecified 09/25/2017, 08/20/2019   PNEUMOCOCCAL CONJUGATE-20 10/10/2021   Pneumococcal Polysaccharide-23 11/24/2013   Tdap 12/10/2017    TDAP status: Up to date  Flu Vaccine status: Up to date  Pneumococcal vaccine status: Up to date  Covid-19 vaccine status: Declined, Education has been provided regarding the importance of this vaccine but patient still declined. Advised may receive this vaccine at local pharmacy or Health Dept.or vaccine clinic. Aware to provide a copy of the vaccination record  if obtained from local pharmacy or Health Dept. Verbalized acceptance and understanding.  Qualifies for Shingles Vaccine? Yes   Zostavax completed No   Shingrix Completed?: No.    Education has been provided regarding the importance of this vaccine. Patient has been advised to call insurance company to determine out of pocket expense if they have not yet received this vaccine. Advised may also receive vaccine at local pharmacy or Health Dept. Verbalized acceptance and understanding.  Screening Tests Health Maintenance  Topic Date Due   COVID-19 Vaccine (1) Never done   Hepatitis C Screening  Never done   Zoster Vaccines- Shingrix (1 of 2) Never done   TETANUS/TDAP  12/11/2027   Pneumonia Vaccine 1+ Years old  Completed   INFLUENZA VACCINE  Completed   HPV VACCINES  Aged Out    Health Maintenance  Health Maintenance Due  Topic Date Due   COVID-19 Vaccine (1) Never done   Hepatitis C Screening  Never done   Zoster Vaccines- Shingrix (1 of 2) Never done    Colorectal cancer screening: No longer required.   Lung Cancer Screening: (Low Dose CT Chest recommended if Age 27-80 years, 30 pack-year currently smoking OR have quit w/in 15years.) does not qualify.    Additional Screening:  Hepatitis C Screening: does qualify; Completed NO  Vision Screening: Recommended annual ophthalmology exams for early detection of glaucoma and other disorders of the eye. Is the patient up to date with their annual eye exam?  No  Who is the provider or what is the name of the office in which the patient attends annual eye exams? Dr.Nice If pt is not established with a provider, would they like to be referred to a provider to establish care? No .   Dental Screening: Recommended annual dental exams for proper oral hygiene  Community Resource Referral / Chronic Care Management: CRR required this visit?  No   CCM required this visit?  No      Plan:     I have personally reviewed and noted the  following in the patients chart:   Medical and social history Use of alcohol, tobacco or illicit drugs  Current medications and supplements including opioid prescriptions. Patient is not currently taking opioid prescriptions. Functional ability and status Nutritional status Physical activity Advanced directives List of other physicians Hospitalizations, surgeries, and ER visits in previous 12 months Vitals Screenings to include cognitive, depression, and falls Referrals and appointments  In addition, I have reviewed and discussed with patient certain preventive protocols, quality metrics, and best practice recommendations. A written personalized care plan for preventive services as well as general preventive health recommendations were provided to patient.     Dionisio David, LPN   02/04/7529   Nurse Notes: none

## 2022-01-01 NOTE — Progress Notes (Addendum)
? ?New Patient Office Visit ?Virtual Visit via Telephone Note ? ?I connected with  James [name] on 01/01/22 at  2:40 PM EST by telephone and verified that I am speaking with the correct person using two identifiers. ? ?Location: ?Patient: home ?Provider: [facility] ?Persons participating in the virtual visit: patient/Nurse Health Advisor ?  ?I discussed the limitations, risks, security and privacy concerns of performing an evaluation and management service by telephone and the availability of in person appointments. The patient expressed understanding and agreed to proceed. ? ?Interactive audio and video telecommunications were attempted between this nurse and patient, however failed, due to patient having technical difficulties OR patient did not have access to video capability.  We continued and completed visit with audio only. ? ?Some vital signs may be absent or patient reported.  ? ?Dionisio David, LPN ? ?Subjective:  ?Patient ID: James [name], male    DOB: [birth year]  Age: 81 y.o. MRN: [id number] ? ?CC: No chief complaint on file. ? ? ?HPI ?James [name] presents for  ? ?Past Medical History:  ?Diagnosis Date  ? Arthritis   ? Asthma   ? Baker's cyst of knee, right   ? BPH (benign prostatic hyperplasia)   ? Bronchitis   ? Carotid stenosis, left   ? Carpal tunnel syndrome, right   ? COPD (chronic obstructive pulmonary disease) ([place])   ? Dysphagia   ? Elevated cholesterol   ? Erectile dysfunction   ? Gastritis and duodenitis   ? GERD (gastroesophageal reflux disease)   ? Neuropathy of right forearm   ? Schatzki's ring   ? Tendinitis   ? Vertigo, intermittent   ? Weight loss   ? ? ?Past Surgical History:  ?Procedure Laterality Date  ? BALLOON DILATION N/A 12/17/2018  ? Procedure: BALLOON DILATION;  Surgeon: Manya Silvas, MD;  Location: [facility] ENDOSCOPY;  Service: Endoscopy;  Laterality: N/A;  ? COLONOSCOPY WITH ESOPHAGOGASTRODUODENOSCOPY (EGD) AND ESOPHAGEAL DILATION (ED)    ?  ESOPHAGOGASTRODUODENOSCOPY (EGD) WITH PROPOFOL N/A 10/26/2016  ? Procedure: ESOPHAGOGASTRODUODENOSCOPY (EGD) WITH PROPOFOL;  Surgeon: Manya Silvas, MD;  Location: [facility] ENDOSCOPY;  Service: Endoscopy;  Laterality: N/A;  ? ESOPHAGOGASTRODUODENOSCOPY (EGD) WITH PROPOFOL N/A 12/17/2018  ? Procedure: ESOPHAGOGASTRODUODENOSCOPY (EGD) WITH PROPOFOL;  Surgeon: Manya Silvas, MD;  Location: [facility] ENDOSCOPY;  Service: Endoscopy;  Laterality: N/A;  ? HERNIA REPAIR    ? SAVORY DILATION N/A 10/26/2016  ? Procedure: SAVORY DILATION;  Surgeon: Manya Silvas, MD;  Location: [facility] ENDOSCOPY;  Service: Endoscopy;  Laterality: N/A;  ? ? ?History reviewed. No pertinent family history. ? ?Social History  ? ?Socioeconomic History  ? Marital status: Married  ?  Spouse name: Not on file  ? Number of children: Not on file  ? Years of education: Not on file  ? Highest education level: Not on file  ?Occupational History  ? Not on file  ?Tobacco Use  ? Smoking status: Every Day  ?  Packs/day: 0.25  ?  Years: 50.00  ?  Pack years: 12.50  ?  Types: Cigarettes  ? Smokeless tobacco: Never  ?Vaping Use  ? Vaping Use: Never used  ?Substance and Sexual Activity  ? Alcohol use: Yes  ?  Alcohol/week: 3.0 standard drinks  ?  Types: 3 Cans of beer per week  ? Drug use: No  ? Sexual activity: Not on file  ?Other Topics Concern  ? Not on file  ?Social History Narrative  ? Not on file  ? ?Social  Determinants of Health  ? ?Financial Resource Strain: Low Risk   ? Difficulty of Paying Living Expenses: Not hard at all  ?Food Insecurity: No Food Insecurity  ? Worried About Charity fundraiser in the Last Year: Never true  ? Ran Out of Food in the Last Year: Never true  ?Transportation Needs: No Transportation Needs  ? Lack of Transportation (Medical): No  ? Lack of Transportation (Non-Medical): No  ?Physical Activity: Sufficiently Active  ? Days of Exercise per Week: 5 days  ? Minutes of Exercise per Session: 60 min  ?Stress: No Stress Concern Present  ?  Feeling of Stress : Not at all  ?Social Connections: Socially Isolated  ? Frequency of Communication with Friends and Family: Twice a week  ? Frequency of Social Gatherings with Friends and Family: Once a week  ? Attends Religious Services: Never  ? Active Member of Clubs or Organizations: No  ? Attends Archivist Meetings: Never  ? Marital Status: Widowed  ?Intimate Partner Violence: Not At Risk  ? Fear of Current or Ex-Partner: No  ? Emotionally Abused: No  ? Physically Abused: No  ? Sexually Abused: No  ? ? ?ROS ?Review of Systems ? ?Objective:  ? ?Today's Vitals: Ht '5\' 10"'$  (1.778 m)   Wt 140 lb (63.5 kg)   BMI 20.09 kg/m?  ? ?Physical Exam ? ?Assessment & Plan:  ? ?Problem List Items Addressed This Visit   ?None ?Visit Diagnoses   ? ? Encounter for Medicare annual wellness exam    -  Primary  ? ?  ? ? ?Outpatient Encounter Medications as of 12/29/2021  ?Medication Sig  ? BREO ELLIPTA 100-25 MCG/ACT AEPB Inhale 1 puff into the lungs daily.  ? diclofenac Sodium (VOLTAREN) 1 % GEL diclofenac 1 % topical gel ? APPLY TO AFFECTED AREA 4 TIMES A DAY  ? doxazosin (CARDURA) 2 MG tablet Take 2 tablets (4 mg total) by mouth daily.  ? doxazosin (CARDURA) 2 MG tablet doxazosin 2 mg tablet ? TAKE 2 TABLETS BY MOUTH EVERY DAY  ? fluticasone (FLONASE) 50 MCG/ACT nasal spray Place 2 sprays into both nostrils daily. Use for 4-6 weeks then stop and use seasonally or as needed.  ? fluticasone furoate-vilanterol (BREO ELLIPTA) 100-25 MCG/ACT AEPB Breo Ellipta 100 mcg-25 mcg/dose powder for inhalation ? INHALE 1 PUFF BY MOUTH EVERY DAY  ? Multiple Vitamin (MULTIVITAMIN WITH MINERALS) TABS tablet Take 1 tablet by mouth daily. Centrum.  ? omeprazole (PRILOSEC) 40 MG capsule Take 1 capsule (40 mg total) by mouth daily.  ? doxycycline (VIBRAMYCIN) 100 MG capsule doxycycline hyclate 100 mg capsule ? TAKE 1 CAPSULE TWICE A DAY BY ORAL ROUTE WITH MEALS FOR 14 DAYS. (Patient not taking: Reported on 12/29/2021)  ? gabapentin  (NEURONTIN) 300 MG capsule gabapentin 300 mg capsule (Patient not taking: Reported on 12/29/2021)  ? levofloxacin (LEVAQUIN) 500 MG tablet Take 1 tablet (500 mg total) by mouth daily. For 7 days (Patient not taking: Reported on 12/29/2021)  ? meclizine (ANTIVERT) 25 MG tablet Take 1 tablet (25 mg total) by mouth 3 (three) times daily as needed for dizziness. (Patient not taking: Reported on 12/29/2021)  ? meloxicam (MOBIC) 7.5 MG tablet meloxicam 7.5 mg tablet ? TAKE 1 TABLET BY MOUTH EVERY DAY (Patient not taking: Reported on 12/29/2021)  ? memantine (NAMENDA) 5 MG tablet memantine 5 mg tablet (Patient not taking: Reported on 12/29/2021)  ? methylPREDNISolone (MEDROL DOSEPAK) 4 MG TBPK tablet methylprednisolone 4 mg tablets in a  dose pack ? TAKE AS DIRECTED (Patient not taking: Reported on 12/29/2021)  ? minocycline (DYNACIN) 100 MG tablet minocycline 100 mg tablet (Patient not taking: Reported on 12/29/2021)  ? naproxen sodium (ANAPROX) 220 MG tablet Take 220-440 mg by mouth 2 (two) times daily as needed (for pain.). (Patient not taking: Reported on 12/29/2021)  ? predniSONE (DELTASONE) 20 MG tablet Take daily with food. Start with '60mg'$  (3 pills) x 2 days, then reduce to '40mg'$  (2 pills) x 2 days, then '20mg'$  (1 pill) x 3 days (Patient not taking: Reported on 12/29/2021)  ? PROAIR HFA 108 (90 Base) MCG/ACT inhaler Inhale 1-2 puffs into the lungs every 6 (six) hours as needed. For shortness of breath (Patient not taking: Reported on 12/29/2021)  ? tadalafil (CIALIS) 20 MG tablet Take 1 tablet (20 mg total) by mouth every other day as needed for erectile dysfunction. (Patient not taking: Reported on 12/29/2021)  ? ?No facility-administered encounter medications on file as of 12/29/2021.  ? ? ?Follow-up: Return in 1 year (on 12/30/2022).  ? ?Dionisio David, LPN ? ?

## 2022-01-08 NOTE — Progress Notes (Signed)
I have reviewed this encounter including the documentation in this note by Dionisio David, LPN ? ?I am certifying that I agree with the content of this note as this patient's Primary Care Physician. ? ?Nobie Putnam, DO ?[facility] ?[facility] ?01/08/2022, 12:50 PM ? ?

## 2022-01-29 ENCOUNTER — Encounter: Payer: Self-pay | Admitting: Family Medicine

## 2022-01-29 ENCOUNTER — Ambulatory Visit (INDEPENDENT_AMBULATORY_CARE_PROVIDER_SITE_OTHER): Payer: PPO | Admitting: Family Medicine

## 2022-01-29 VITALS — BP 130/62 | HR 67 | Ht 70.0 in | Wt 145.0 lb

## 2022-01-29 DIAGNOSIS — E7800 Pure hypercholesterolemia, unspecified: Secondary | ICD-10-CM

## 2022-01-29 DIAGNOSIS — J432 Centrilobular emphysema: Secondary | ICD-10-CM

## 2022-01-29 DIAGNOSIS — N401 Enlarged prostate with lower urinary tract symptoms: Secondary | ICD-10-CM

## 2022-01-29 DIAGNOSIS — R3911 Hesitancy of micturition: Secondary | ICD-10-CM | POA: Diagnosis not present

## 2022-01-29 MED ORDER — PRAVASTATIN SODIUM 10 MG PO TABS: 10.0000 mg | ORAL_TABLET | Freq: Every day | ORAL | 3 refills | Status: DC

## 2022-01-29 NOTE — Assessment & Plan Note (Signed)
Stable COPD Emphysema ?On Breo ?Improved ?Previous order for LDCT ?

## 2022-01-29 NOTE — Assessment & Plan Note (Signed)
Elevated LDL 111 ?Prior Statin in past 6 years ago ?Will restart Statin therapy today Pravastatin '10mg'$  nightly ?

## 2022-01-29 NOTE — Patient Instructions (Addendum)
Thank you for coming to the office today. ? ?Re ordered Cholesterol medicine today. ? ?Keep on prostate medication ? ? ?Please schedule a Follow-up Appointment to: Return in about 4 months (around 05/31/2022) for 4 month follow-up. ? ?If you have any other questions or concerns, please feel free to call the office or send a message through [place]. You may also schedule an earlier appointment if necessary. ? ?Additionally, you may be receiving a survey about your experience at our office within a few days to 1 week by e-mail or mail. We value your feedback. ? ?Nobie Putnam, DO ?[place] ?

## 2022-01-29 NOTE — Assessment & Plan Note (Signed)
Mostly controlled ?Continues on alpha blocker Doxazosin ?Recently purchased OTC saw palmetto as well ?

## 2022-01-29 NOTE — Progress Notes (Signed)
? ?Subjective:  ? ? Patient ID: James [name], male    DOB: [birth year], 81 y.o.   MRN: [id number] ? ?James [name] is a 81 y.o. male presenting on 01/29/2022 for No chief complaint on file. ? ? ?HPI ? ? ?BPH LUTS ?He takes Doxazosin 74m x 2 = 447mdaily in AM. He has difficulty starting urination. ?He started a prostate pill from [facility] OTC - suspect Saw Palmetto ?  ?Inguinal Hernia ?S/p mesh repair years ago 10-15+ ?He admits some deeper inguinal pressure ?He has episodic pain with inc activity ?  ?Constipation ?He admits problem with constipation. He can go up to 5-6 days without BM. Straining constipation. ?  ?Centrilobular Emphysema ?Tobacco Abuse ?Active smoker, quarter pack per day 6-7 per day ?He can do what he needs to do daily activities ?On Breo inhaler daily, Albuterol PRN ?Needs refill Breo ? ?HYPERLIPIDEMIA: ?- Reports no concerns. Last lipid panel 09/2021, mild elevated LDL >111 ?Not on Statin. Previously on Pravastatin ?Will re order ?Admits eating some high cholesterol meats, sausage ?  ?Chronic Hearing Loss, sensorineural ?  ?GERD ?Controlled on Omeprazole 4037maily ?  ?History of Alzheimer's Dementia ?Previously on Donepezil 45m71mt says did not help. Has come off med. ?On Namenda ? ? ?Health Maintenance: ? ?PSA 0.52 (09/2021) no sign of prostate cancer. ? ? ?  12/29/2021  ?  2:46 PM 11/29/2021  ? 12:26 PM 08/08/2021  ?  8:46 AM  ?Depression screen PHQ 2/9  ?Decreased Interest 0 0 0  ?Down, Depressed, Hopeless 0 0 0  ?PHQ - 2 Score 0 0 0  ? ? ?Social History  ? ?Tobacco Use  ? Smoking status: Every Day  ?  Packs/day: 0.25  ?  Years: 50.00  ?  Pack years: 12.50  ?  Types: Cigarettes  ? Smokeless tobacco: Never  ?Vaping Use  ? Vaping Use: Never used  ?Substance Use Topics  ? Alcohol use: Yes  ?  Alcohol/week: 3.0 standard drinks  ?  Types: 3 Cans of beer per week  ? Drug use: No  ? ? ?Review of Systems ?Per HPI unless specifically indicated above ? ?   ?Objective:  ?  ?BP 130/62   Pulse 67   Ht 5'  10" (1.778 m)   Wt 145 lb (65.8 kg)   SpO2 100%   BMI 20.81 kg/m?   ?Wt Readings from Last 3 Encounters:  ?01/29/22 145 lb (65.8 kg)  ?12/29/21 140 lb (63.5 kg)  ?11/29/21 140 lb 3.2 oz (63.6 kg)  ?  ?Physical Exam ?Vitals and nursing note reviewed.  ?Constitutional:   ?   General: He is not in acute distress. ?   Appearance: Normal appearance. He is well-developed. He is not diaphoretic.  ?   Comments: Well-appearing, comfortable, cooperative  ?HENT:  ?   Head: Normocephalic and atraumatic.  ?Eyes:  ?   General:     ?   Right eye: No discharge.     ?   Left eye: No discharge.  ?   Conjunctiva/sclera: Conjunctivae normal.  ?Cardiovascular:  ?   Rate and Rhythm: Normal rate.  ?Pulmonary:  ?   Effort: Pulmonary effort is normal.  ?Skin: ?   General: Skin is warm and dry.  ?   Findings: No erythema or rash.  ?Neurological:  ?   Mental Status: He is alert and oriented to person, place, and time.  ?Psychiatric:     ?   Mood and Affect: Mood normal.     ?  Behavior: Behavior normal.     ?   Thought Content: Thought content normal.  ?   Comments: Well groomed, good eye contact, normal speech and thoughts  ? ?Results for orders placed or performed in visit on 10/12/21  ?PSA  ?Result Value Ref Range  ? PSA 0.52 < OR = 4.00 ng/mL  ?Hemoglobin A1c  ?Result Value Ref Range  ? Hgb A1c MFr Bld 5.4 <5.7 % of total Hgb  ? Mean Plasma Glucose 108 mg/dL  ? eAG (mmol/L) 6.0 mmol/L  ?CBC with Differential/Platelet  ?Result Value Ref Range  ? WBC 5.8 3.8 - 10.8 Thousand/uL  ? RBC 5.10 4.20 - 5.80 Million/uL  ? Hemoglobin 15.3 13.2 - 17.1 g/dL  ? HCT 46.2 38.5 - 50.0 %  ? MCV 90.6 80.0 - 100.0 fL  ? MCH 30.0 27.0 - 33.0 pg  ? MCHC 33.1 32.0 - 36.0 g/dL  ? RDW 13.3 11.0 - 15.0 %  ? Platelets 263 140 - 400 Thousand/uL  ? MPV 11.9 7.5 - 12.5 fL  ? Neutro Abs 3,584 1,500 - 7,800 cells/uL  ? Lymphs Abs 1,404 850 - 3,900 cells/uL  ? Absolute Monocytes 429 200 - 950 cells/uL  ? Eosinophils Absolute 354 15 - 500 cells/uL  ? Basophils  Absolute 29 0 - 200 cells/uL  ? Neutrophils Relative % 61.8 %  ? Total Lymphocyte 24.2 %  ? Monocytes Relative 7.4 %  ? Eosinophils Relative 6.1 %  ? Basophils Relative 0.5 %  ?Lipid panel  ?Result Value Ref Range  ? Cholesterol 169 <200 mg/dL  ? HDL 43 > OR = 40 mg/dL  ? Triglycerides 63 <150 mg/dL  ? LDL Cholesterol (Calc) 111 (H) mg/dL (calc)  ? Total CHOL/HDL Ratio 3.9 <5.0 (calc)  ? Non-HDL Cholesterol (Calc) 126 <130 mg/dL (calc)  ?COMPLETE METABOLIC PANEL WITH GFR  ?Result Value Ref Range  ? Glucose, Bld 94 65 - 139 mg/dL  ? BUN 21 7 - 25 mg/dL  ? Creat 1.18 0.70 - 1.28 mg/dL  ? eGFR 63 > OR = 60 mL/min/1.57m  ? BUN/Creatinine Ratio NOT APPLICABLE 6 - 22 (calc)  ? Sodium 141 135 - 146 mmol/L  ? Potassium 4.6 3.5 - 5.3 mmol/L  ? Chloride 108 98 - 110 mmol/L  ? CO2 26 20 - 32 mmol/L  ? Calcium 9.2 8.6 - 10.3 mg/dL  ? Total Protein 6.2 6.1 - 8.1 g/dL  ? Albumin 4.1 3.6 - 5.1 g/dL  ? Globulin 2.1 1.9 - 3.7 g/dL (calc)  ? AG Ratio 2.0 1.0 - 2.5 (calc)  ? Total Bilirubin 0.8 0.2 - 1.2 mg/dL  ? Alkaline phosphatase (APISO) 73 35 - 144 U/L  ? AST 23 10 - 35 U/L  ? ALT 16 9 - 46 U/L  ? ?   ?Assessment & Plan:  ? ?Problem List Items Addressed This Visit   ? ? Pure hypercholesterolemia  ?  Elevated LDL 111 ?Prior Statin in past 6 years ago ?Will restart Statin therapy today Pravastatin 132mnightly ?  ?  ? Relevant Medications  ? pravastatin (PRAVACHOL) 10 MG tablet  ? [place]  ?  Stable COPD Emphysema ?On Breo ?Improved ?Previous order for LDCT ?  ?  ? Benign prostatic hyperplasia with urinary hesitancy  ?  Mostly controlled ?Continues on alpha blocker Doxazosin ?Recently purchased OTC saw palmetto as well ?  ?  ?  ?Meds ordered this encounter  ?Medications  ? pravastatin (PRAVACHOL) 10 MG tablet  ?  Sig: Take 1 tablet (10 mg total) by mouth at bedtime.  ?  Dispense:  90 tablet  ?  Refill:  3  ? ? ? ? ?Follow up plan: ?Return in about 4 months (around 05/31/2022) for 4 month  follow-up. ? ?Nobie Putnam, DO ?[facility] ?[facility] ?01/29/2022, 8:58 AM ?

## 2022-03-30 DIAGNOSIS — C44619 Basal cell carcinoma of skin of left upper limb, including shoulder: Secondary | ICD-10-CM | POA: Diagnosis not present

## 2022-03-30 DIAGNOSIS — D2261 Melanocytic nevi of right upper limb, including shoulder: Secondary | ICD-10-CM | POA: Diagnosis not present

## 2022-03-30 DIAGNOSIS — L814 Other melanin hyperpigmentation: Secondary | ICD-10-CM | POA: Diagnosis not present

## 2022-03-30 DIAGNOSIS — L821 Other seborrheic keratosis: Secondary | ICD-10-CM | POA: Diagnosis not present

## 2022-03-30 DIAGNOSIS — L570 Actinic keratosis: Secondary | ICD-10-CM | POA: Diagnosis not present

## 2022-03-30 DIAGNOSIS — Z85828 Personal history of other malignant neoplasm of skin: Secondary | ICD-10-CM | POA: Diagnosis not present

## 2022-03-30 DIAGNOSIS — D045 Carcinoma in situ of skin of trunk: Secondary | ICD-10-CM | POA: Diagnosis not present

## 2022-03-30 DIAGNOSIS — D485 Neoplasm of uncertain behavior of skin: Secondary | ICD-10-CM | POA: Diagnosis not present

## 2022-03-30 DIAGNOSIS — D2262 Melanocytic nevi of left upper limb, including shoulder: Secondary | ICD-10-CM | POA: Diagnosis not present

## 2022-04-09 DIAGNOSIS — C44619 Basal cell carcinoma of skin of left upper limb, including shoulder: Secondary | ICD-10-CM | POA: Diagnosis not present

## 2022-04-09 DIAGNOSIS — D045 Carcinoma in situ of skin of trunk: Secondary | ICD-10-CM | POA: Diagnosis not present

## 2022-04-16 ENCOUNTER — Other Ambulatory Visit: Payer: Self-pay | Admitting: Family Medicine

## 2022-04-16 DIAGNOSIS — N401 Enlarged prostate with lower urinary tract symptoms: Secondary | ICD-10-CM

## 2022-04-17 NOTE — Telephone Encounter (Signed)
Pts daughter called to check on the status of this refill request, please advise

## 2022-04-17 NOTE — Telephone Encounter (Signed)
Requested medication (s) are due for refill today: no Requested medication (s) are on the active medication list: historical med  Last refill:  10/10/21 #180 3 RF  Future visit scheduled: yes  Notes to clinic:  historical med   Requested Prescriptions  Pending Prescriptions Disp Refills   doxazosin (CARDURA) 2 MG tablet [Pharmacy Med Name: DOXAZOSIN MESYLATE 2 MG TAB] 60 tablet     Sig: TAKE 2 TABLETS BY MOUTH ONCE A DAY FOLLOW UP WITH PROVIDER FOR FURTHER REFILLS.     Cardiovascular:  Alpha Blockers Passed - 04/16/2022 12:30 PM      Passed - Last BP in normal range    BP Readings from Last 1 Encounters:  01/29/22 130/62         Passed - Valid encounter within last 6 months    Recent Outpatient Visits           2 months ago Centrilobular emphysema ([place])   [place], DO   4 months ago Centrilobular emphysema [facility])   [place], DO   6 months ago Annual physical exam   [place], DO   8 months ago Gastroesophageal reflux disease without esophagitis   [place], DO   9 months ago Insurance coverage problems   [place], DO       Future Appointments             In 1 month [place], Devonne Doughty, [facility], [facility]

## 2022-04-19 ENCOUNTER — Telehealth: Payer: Self-pay

## 2022-04-19 DIAGNOSIS — N401 Enlarged prostate with lower urinary tract symptoms: Secondary | ICD-10-CM

## 2022-04-19 MED ORDER — TERAZOSIN HCL 2 MG PO CAPS: 2.0000 mg | ORAL_CAPSULE | Freq: Every day | ORAL | 0 refills | Status: DC

## 2022-04-19 NOTE — Telephone Encounter (Signed)
Please notify patient. I have switched med.  Discontinue Doxazosin due to cost.  Switch to Terazosin '2mg'$  capsule nightly. If need higher dose let me know in 30 days we can increase to '4mg'$ .  Nobie Putnam, [facility] 04/19/2022, 11:49 AM

## 2022-04-19 NOTE — Telephone Encounter (Signed)
Copied from [place] [id number]. Topic: General - Other >> Apr 18, 2022  4:25 PM Everette C wrote: Reason for CRM: The patient went to pick up their doxazosin (CARDURA) 2 MG tablet [[id number]]  prescription and was told the out of pocket cost was more than $30   The patient would like to be prescribed a cost effective alternative if possible   Please contact further when available

## 2022-05-03 ENCOUNTER — Other Ambulatory Visit: Payer: Self-pay | Admitting: Family Medicine

## 2022-05-03 DIAGNOSIS — N401 Enlarged prostate with lower urinary tract symptoms: Secondary | ICD-10-CM

## 2022-05-04 NOTE — Telephone Encounter (Signed)
Requested Prescriptions  Pending Prescriptions Disp Refills  . terazosin (HYTRIN) 2 MG capsule [Pharmacy Med Name: TERAZOSIN 2 MG CAPSULE] 90 capsule 1    Sig: TAKE 1 CAPSULE BY MOUTH AT BEDTIME. PLEASE NOTIFY DOCTOR'S OFFICE IF NEED HIGHER DOSE AFTER 30 DAYS.     Cardiovascular:  Alpha Blockers Passed - 05/03/2022  7:41 PM      Passed - Last BP in normal range    BP Readings from Last 1 Encounters:  01/29/22 130/62         Passed - Valid encounter within last 6 months    Recent Outpatient Visits          3 months ago Centrilobular emphysema ([place])   [place], DO   5 months ago Centrilobular emphysema [facility])   [place], DO   6 months ago Annual physical exam   [place], DO   8 months ago Gastroesophageal reflux disease without esophagitis   [place], DO   10 months ago Insurance coverage problems   [place], DO      Future Appointments            In 3 weeks Parks Ranger, Devonne Doughty, [facility], [facility]

## 2022-05-31 ENCOUNTER — Other Ambulatory Visit: Payer: Self-pay | Admitting: Family Medicine

## 2022-05-31 ENCOUNTER — Encounter: Payer: Self-pay | Admitting: Family Medicine

## 2022-05-31 ENCOUNTER — Ambulatory Visit (INDEPENDENT_AMBULATORY_CARE_PROVIDER_SITE_OTHER): Payer: PPO | Admitting: Family Medicine

## 2022-05-31 VITALS — BP 124/77 | HR 62 | Ht 70.0 in | Wt 142.4 lb

## 2022-05-31 DIAGNOSIS — J432 Centrilobular emphysema: Secondary | ICD-10-CM | POA: Diagnosis not present

## 2022-05-31 DIAGNOSIS — N401 Enlarged prostate with lower urinary tract symptoms: Secondary | ICD-10-CM

## 2022-05-31 DIAGNOSIS — R3911 Hesitancy of micturition: Secondary | ICD-10-CM

## 2022-05-31 DIAGNOSIS — J310 Chronic rhinitis: Secondary | ICD-10-CM

## 2022-05-31 DIAGNOSIS — E7800 Pure hypercholesterolemia, unspecified: Secondary | ICD-10-CM

## 2022-05-31 DIAGNOSIS — J329 Chronic sinusitis, unspecified: Secondary | ICD-10-CM | POA: Diagnosis not present

## 2022-05-31 DIAGNOSIS — N529 Male erectile dysfunction, unspecified: Secondary | ICD-10-CM

## 2022-05-31 DIAGNOSIS — R7309 Other abnormal glucose: Secondary | ICD-10-CM

## 2022-05-31 DIAGNOSIS — Z0000 Encounter for general adult medical examination without abnormal findings: Secondary | ICD-10-CM

## 2022-05-31 MED ORDER — TERAZOSIN HCL 2 MG PO CAPS: 2.0000 mg | ORAL_CAPSULE | Freq: Every day | ORAL | 3 refills | Status: DC

## 2022-05-31 MED ORDER — TADALAFIL 20 MG PO TABS: 20.0000 mg | ORAL_TABLET | ORAL | 11 refills | Status: DC | PRN

## 2022-05-31 NOTE — Assessment & Plan Note (Signed)
Rx Tadalafil '20mg'$  q 48 hr PRN

## 2022-05-31 NOTE — Patient Instructions (Addendum)
Thank you for coming to the office today.  Keep nasal spray to decongest  No sign of infection or COPD flare today.  Let me know if difficulty breathing  Re ordered generic Cialis to [place] visit next time. Please be fasting for BLOOD WORK (no food or drink after midnight before the lab appointment, only water or coffee without cream/sugar on the morning of)   Please schedule a Follow-up Appointment to: Return in about 4 months (around 09/30/2022) for 4 month Annual Physical AM fasting lab AFTER.  If you have any other questions or concerns, please feel free to call the office or send a message through [place]. You may also schedule an earlier appointment if necessary.  Additionally, you may be receiving a survey about your experience at our office within a few days to 1 week by e-mail or mail. We value your feedback.  Nobie Putnam, DO [place]

## 2022-05-31 NOTE — Assessment & Plan Note (Signed)
Mostly controlled Continues on alpha blocker Terazosin

## 2022-05-31 NOTE — Progress Notes (Signed)
Subjective:    Patient ID: James [name], male    DOB: [birth year], 81 y.o.   MRN: [id number]  James [name] is a 81 y.o. male presenting on 05/31/2022 for COPD and Anorexia   HPI  Sinusitis Post nasal drainage Admits cause night time cough and impacting his appetite.  BPH LUTS He takes Doxazosin 23m x 2 = 461mdaily in AM. He has difficulty starting urination. He started a prostate pill from [facility] - suspect Saw Palmetto   Inguinal Hernia S/p mesh repair years ago 10-15+ He admits some deeper inguinal pressure He has episodic pain with inc activity   Constipation He admits problem with constipation. He can go up to 5-6 days without BM. Straining constipation.   Centrilobular Emphysema Tobacco Abuse Active smoker, quarter pack per day 6-7 per day He can do what he needs to do daily activities On Breo inhaler daily, Albuterol PRN   HYPERLIPIDEMIA: - Reports no concerns. Last lipid panel 09/2021, mild elevated LDL >111 Not on Statin. Previously on Pravastatin Will re order Admits eating some high cholesterol meats, sausage   Chronic Hearing Loss, sensorineural   GERD Controlled on Omeprazole 4072maily   History of Alzheimer's Dementia Previously on Donepezil 7m31mt says did not help. Has come off med. On Namenda  Erectile Dysfunction Request re order on Tadalafil 20mg8m8  hr PRN [facility] [place]:   PSA 0.52 (09/2021) no sign of prostate cancer.      12/29/2021    2:46 PM 11/29/2021   12:26 PM 08/08/2021    8:46 AM  Depression screen PHQ 2/9  Decreased Interest 0 0 0  Down, Depressed, Hopeless 0 0 0  PHQ - 2 Score 0 0 0    Social History   Tobacco Use   Smoking status: Every Day    Packs/day: 0.25    Years: 50.00    Total pack years: 12.50    Types: Cigarettes   Smokeless tobacco: Never  Vaping Use   Vaping Use: Never used  Substance Use Topics   Alcohol use: Yes    Alcohol/week: 3.0 standard drinks of  alcohol    Types: 3 Cans of beer per week   Drug use: No    Review of Systems Per HPI unless specifically indicated above     Objective:    BP 124/77   Pulse 62   Ht 5' 10"  (1.778 m)   Wt 142 lb 6.4 oz (64.6 kg)   SpO2 95%   BMI 20.43 kg/m   Wt Readings from Last 3 Encounters:  05/31/22 142 lb 6.4 oz (64.6 kg)  01/29/22 145 lb (65.8 kg)  12/29/21 140 lb (63.5 kg)    Physical Exam   Results for orders placed or performed in visit on 10/12/21  PSA  Result Value Ref Range   PSA 0.52 < OR = 4.00 ng/mL  Hemoglobin A1c  Result Value Ref Range   Hgb A1c MFr Bld 5.4 <5.7 % of total Hgb   Mean Plasma Glucose 108 mg/dL   eAG (mmol/L) 6.0 mmol/L  CBC with Differential/Platelet  Result Value Ref Range   WBC 5.8 3.8 - 10.8 Thousand/uL   RBC 5.10 4.20 - 5.80 Million/uL   Hemoglobin 15.3 13.2 - 17.1 g/dL   HCT 46.2 38.5 - 50.0 %   MCV 90.6 80.0 - 100.0 fL   MCH 30.0 27.0 - 33.0 pg   MCHC 33.1 32.0 -  36.0 g/dL   RDW 13.3 11.0 - 15.0 %   Platelets 263 140 - 400 Thousand/uL   MPV 11.9 7.5 - 12.5 fL   Neutro Abs 3,584 1,500 - 7,800 cells/uL   Lymphs Abs 1,404 850 - 3,900 cells/uL   Absolute Monocytes 429 200 - 950 cells/uL   Eosinophils Absolute 354 15 - 500 cells/uL   Basophils Absolute 29 0 - 200 cells/uL   Neutrophils Relative % 61.8 %   Total Lymphocyte 24.2 %   Monocytes Relative 7.4 %   Eosinophils Relative 6.1 %   Basophils Relative 0.5 %  Lipid panel  Result Value Ref Range   Cholesterol 169 <200 mg/dL   HDL 43 > OR = 40 mg/dL   Triglycerides 63 <150 mg/dL   LDL Cholesterol (Calc) 111 (H) mg/dL (calc)   Total CHOL/HDL Ratio 3.9 <5.0 (calc)   Non-HDL Cholesterol (Calc) 126 <130 mg/dL (calc)  COMPLETE METABOLIC PANEL WITH GFR  Result Value Ref Range   Glucose, Bld 94 65 - 139 mg/dL   BUN 21 7 - 25 mg/dL   Creat 1.18 0.70 - 1.28 mg/dL   eGFR 63 > OR = 60 mL/min/1.52m   BUN/Creatinine Ratio NOT APPLICABLE 6 - 22 (calc)   Sodium 141 135 - 146 mmol/L    Potassium 4.6 3.5 - 5.3 mmol/L   Chloride 108 98 - 110 mmol/L   CO2 26 20 - 32 mmol/L   Calcium 9.2 8.6 - 10.3 mg/dL   Total Protein 6.2 6.1 - 8.1 g/dL   Albumin 4.1 3.6 - 5.1 g/dL   Globulin 2.1 1.9 - 3.7 g/dL (calc)   AG Ratio 2.0 1.0 - 2.5 (calc)   Total Bilirubin 0.8 0.2 - 1.2 mg/dL   Alkaline phosphatase (APISO) 73 35 - 144 U/L   AST 23 10 - 35 U/L   ALT 16 9 - 46 U/L      Assessment & Plan:   Problem List Items Addressed This Visit     Benign prostatic hyperplasia with urinary hesitancy    Mostly controlled Continues on alpha blocker Terazosin      Relevant Medications   terazosin (HYTRIN) 2 MG capsule   Centrilobular emphysema (HCC) - Primary    Stable COPD Emphysema On Breo      Erectile dysfunction    Rx Tadalafil 262mq 48 hr PRN      Relevant Medications   tadalafil (CIALIS) 20 MG tablet   Other Visit Diagnoses     Chronic rhinosinusitis           Sinusitis Discussed allergy therapy anti histamine, Flonase course  Meds ordered this encounter  Medications   tadalafil (CIALIS) 20 MG tablet    Sig: Take 1 tablet (20 mg total) by mouth every other day as needed for erectile dysfunction.    Dispense:  45 tablet    Refill:  11    He will use goodrx coupon   terazosin (HYTRIN) 2 MG capsule    Sig: Take 1 capsule (2 mg total) by mouth at bedtime.    Dispense:  90 capsule    Refill:  3    Add refills on file, keep same dose.      Follow up plan: Return in about 4 months (around 09/30/2022) for 4 month Annual Physical AM fasting lab AFTER.  Future labs ordered for 09/2022   [facility]ie [facility] 05/31/2022, 8:36 AM

## 2022-05-31 NOTE — Assessment & Plan Note (Signed)
Stable COPD Emphysema On Breo

## 2022-06-21 ENCOUNTER — Ambulatory Visit: Payer: Self-pay | Admitting: *Deleted

## 2022-06-21 NOTE — Telephone Encounter (Signed)
  Chief Complaint: dizziness Symptoms: Vertigo, spinning at times duration 10 minutes. H/O 2019, states gradually worsening, does not occur daily but did today. Reports nausea when occurs. Frequency: Today, not presently Pertinent Negatives: Patient denies vomiting Disposition: '[]'$ ED /'[]'$ Urgent Care (no appt availability in office) / '[x]'$ Appointment(In office/virtual)/ '[]'$  [facility]/ '[]'$ Home Care/ '[]'$ Refused Recommended Disposition /'[]'$ [place] [facility]/ '[]'$  Follow-up with PCP Additional Notes: Appt secured for tomorrow. Care advise provided, pt verbalizes understanding.   Reason for Disposition  [1] NO dizziness now AND [2] one or more stroke risk factors (i.e., hypertension, diabetes, prior stroke/TIA/heart attack)  Answer Assessment - Initial Assessment Questions 1. DESCRIPTION: "Describe your dizziness."     Spinning 2. VERTIGO: "Do you feel like either you or the room is spinning or tilting?"      yes 3. LIGHTHEADED: "Do you feel lightheaded?" (e.g., somewhat faint, woozy, weak upon standing)     At times 4. SEVERITY: "How bad is it?"  "Can you walk?"   - MILD: Feels slightly dizzy and unsteady, but is walking normally.   - MODERATE: Feels unsteady when walking, but not falling; interferes with normal activities (e.g., school, work).   - SEVERE: Unable to walk without falling, or requires assistance to walk without falling.     Severe for about 10 minutes 5. ONSET:  "When did the dizziness begin?"     "For years" Worsening x 1 month 6. AGGRAVATING FACTORS: "Does anything make it worse?" (e.g., standing, change in head position)     Bending over and coming back up 7. CAUSE: "What do you think is causing the dizziness?"      8. RECURRENT SYMPTOM: "Have you had dizziness before?" If Yes, ask: "When was the last time?" "What happened that time?"     Yes 9. OTHER SYMPTOMS: "Do you have any other symptoms?" (e.g., headache, weakness, numbness, vomiting, earache)      Nauseated. Does not occur daily, did occur today  Protocols used: Dizziness - Vertigo-A-AH

## 2022-06-22 ENCOUNTER — Encounter: Payer: Self-pay | Admitting: Physician Assistant

## 2022-06-22 ENCOUNTER — Ambulatory Visit (INDEPENDENT_AMBULATORY_CARE_PROVIDER_SITE_OTHER): Payer: PPO | Admitting: Physician Assistant

## 2022-06-22 VITALS — BP 125/66 | HR 66 | Ht 70.0 in | Wt 144.0 lb

## 2022-06-22 DIAGNOSIS — R42 Dizziness and giddiness: Secondary | ICD-10-CM

## 2022-06-22 NOTE — Patient Instructions (Addendum)
There are many causes for dizziness I am going to check your labs to see if you are dehydrated or anemic- we will call you with the results once they are available  Make sure you are staying well hydrated throughout the day- at least 70 oz of water per day  Make sure you are eating regularly spaced meals and snacks- especially when you are working or exerting yourself as low blood sugars can lead to your dizziness and cold sweats (make sure your meals have a mix of protein, carbohydrates, and fats)  If you find yourself getting dizzy again please do the following: Tell someone- Have someone observe you for signs of stroke and let them help you sit down somewhere stable.  Lower yourself into a chair or the floor to prevent falls or injuries if you pass out Drink water and have a snack Make slow movements or changes in position to reduce risk of further dizziness Call EMS if you are not improving with these measure.   Try using Debrox ear drops in your ears for the next 5-7 days to help with the earwax in both ears DO NOT USE QTIPS  TO CLEAN YOUR EARS- do not stick anything in your ears as this can push wax further in or damage the eardrum

## 2022-06-22 NOTE — Assessment & Plan Note (Signed)
Acute concern Reports several incidents of this over the past month, no falls or LOC  He is unsure of causes, PE is reassuring for ears, lungs, heart at this time Orthostatics performed- moderate changes which could account for dizziness noted Will check CBC and CMP for potential etiology as well- results to dictate further management  Recommend he stay well hydrated, eat regular snacks and meals with appropriate servings of protein, carbs, fats to prevent low blood glucose Reviewed dizziness safety precautions May need to evaluate medications for BP lowering if this continues  Follow up as needed

## 2022-06-22 NOTE — Progress Notes (Signed)
Established Patient Office Visit  Name: James [name]   MRN: [id number]    DOB: 12-15-1940   Date:06/22/2022  Today's Provider: Talitha Givens, MHS, PA-C Introduced myself to the patient as a PA-C and provided education on APPs in clinical practice.         Subjective  Chief Complaint  Chief Complaint  Patient presents with   Dizziness     HPI  Reports he has had problems with dizziness for most of his life States yesterday he experienced severe dizziness and nausea while changing positions Resolved in about 10-15 minutes and had a cold sweat and felt ill  Reports it happened around 2:00 yesterday and had eaten a hot dog about an hour prior      Patient Active Problem List   Diagnosis Date Noted   Asthma 12/29/2021   Low back pain 12/29/2021   Schatzki's ring 12/29/2021   Elevated LDL cholesterol level 10/19/2021   Gastroesophageal reflux disease without esophagitis 08/08/2021   Centrilobular emphysema ([place]) 08/08/2021   Benign prostatic hyperplasia with urinary hesitancy 08/08/2021   Intermittent vertigo 08/07/2018   Benign prostatic hyperplasia with incomplete bladder emptying 08/07/2018   Baker's cyst of knee, right 09/25/2017   Carpal tunnel syndrome of right wrist 09/13/2016   Neuropathy of right forearm 07/24/2016   Other enthesopathies, not elsewhere classified 06/02/2016   Carotid stenosis, left 12/19/2015   Pure hypercholesterolemia 12/19/2015   Dizziness 12/02/2015   Perennial allergic rhinitis 12/02/2015   SDAT (senile dementia of Alzheimer's type) ([place]) 12/02/2015   Chronic obstructive pulmonary disease ([place]) 10/18/2014   Bilateral impacted cerumen 09/06/2014   Current tobacco use 09/06/2014   Right elbow pain 09/06/2014   Osteoarthritis of spine with radiculopathy, lumbar region 07/28/2014   Gastritis and duodenitis 06/10/2014   Heartburn 05/11/2014   Unintended weight loss 05/11/2014   Dysphagia 04/20/2014   Erectile dysfunction  04/20/2014    Past Surgical History:  Procedure Laterality Date   BALLOON DILATION N/A 12/17/2018   Procedure: BALLOON DILATION;  Surgeon: Manya Silvas, MD;  Location: [facility] ENDOSCOPY;  Service: Endoscopy;  Laterality: N/A;   COLONOSCOPY WITH ESOPHAGOGASTRODUODENOSCOPY (EGD) AND ESOPHAGEAL DILATION (ED)     ESOPHAGOGASTRODUODENOSCOPY (EGD) WITH PROPOFOL N/A 10/26/2016   Procedure: ESOPHAGOGASTRODUODENOSCOPY (EGD) WITH PROPOFOL;  Surgeon: Manya Silvas, MD;  Location: [facility] ENDOSCOPY;  Service: Endoscopy;  Laterality: N/A;   ESOPHAGOGASTRODUODENOSCOPY (EGD) WITH PROPOFOL N/A 12/17/2018   Procedure: ESOPHAGOGASTRODUODENOSCOPY (EGD) WITH PROPOFOL;  Surgeon: Manya Silvas, MD;  Location: [facility] ENDOSCOPY;  Service: Endoscopy;  Laterality: N/A;   HERNIA REPAIR     SAVORY DILATION N/A 10/26/2016   Procedure: SAVORY DILATION;  Surgeon: Manya Silvas, MD;  Location: [facility] ENDOSCOPY;  Service: Endoscopy;  Laterality: N/A;    History reviewed. No pertinent family history.  Social History   Tobacco Use   Smoking status: Every Day    Packs/day: 0.25    Years: 50.00    Total pack years: 12.50    Types: Cigarettes   Smokeless tobacco: Never  Substance Use Topics   Alcohol use: Yes    Alcohol/week: 3.0 standard drinks of alcohol    Types: 3 Cans of beer per week     Current Outpatient Medications:    BREO ELLIPTA 100-25 MCG/ACT AEPB, Inhale 1 puff into the lungs daily., Disp: 60 each, Rfl: 11   diclofenac Sodium (VOLTAREN) 1 % GEL, diclofenac 1 % topical gel  APPLY TO AFFECTED AREA 4 TIMES  A DAY, Disp: , Rfl:    fluticasone (FLONASE) 50 MCG/ACT nasal spray, Place 2 sprays into both nostrils daily. Use for 4-6 weeks then stop and use seasonally or as needed., Disp: 16 g, Rfl: 3   fluticasone furoate-vilanterol (BREO ELLIPTA) 100-25 MCG/ACT AEPB, Breo Ellipta 100 mcg-25 mcg/dose powder for inhalation  INHALE 1 PUFF BY MOUTH EVERY DAY, Disp: , Rfl:    gabapentin (NEURONTIN) 300 MG  capsule, , Disp: , Rfl:    meclizine (ANTIVERT) 25 MG tablet, Take 1 tablet (25 mg total) by mouth 3 (three) times daily as needed for dizziness., Disp: 30 tablet, Rfl: 0   meloxicam (MOBIC) 7.5 MG tablet, , Disp: , Rfl:    memantine (NAMENDA) 5 MG tablet, , Disp: , Rfl:    minocycline (DYNACIN) 100 MG tablet, , Disp: , Rfl:    Multiple Vitamin (MULTIVITAMIN WITH MINERALS) TABS tablet, Take 1 tablet by mouth daily. Centrum., Disp: , Rfl:    naproxen sodium (ANAPROX) 220 MG tablet, Take 220-440 mg by mouth 2 (two) times daily as needed (for pain.)., Disp: , Rfl:    omeprazole (PRILOSEC) 40 MG capsule, Take 1 capsule (40 mg total) by mouth daily., Disp: 90 capsule, Rfl: 3   pravastatin (PRAVACHOL) 10 MG tablet, Take 1 tablet (10 mg total) by mouth at bedtime., Disp: 90 tablet, Rfl: 3   PROAIR HFA 108 (90 Base) MCG/ACT inhaler, Inhale 1-2 puffs into the lungs every 6 (six) hours as needed. For shortness of breath, Disp: , Rfl:    tadalafil (CIALIS) 20 MG tablet, Take 1 tablet (20 mg total) by mouth every other day as needed for erectile dysfunction., Disp: 45 tablet, Rfl: 11   terazosin (HYTRIN) 2 MG capsule, Take 1 capsule (2 mg total) by mouth at bedtime., Disp: 90 capsule, Rfl: 3  Allergies  Allergen Reactions   Aspirin Hives, Swelling and Other (See Comments)    Blisters    I personally reviewed active problem list, medication list, allergies, notes from last encounter, lab results with the patient/caregiver today.   Review of Systems  Constitutional:  Positive for chills and diaphoresis.  Gastrointestinal:  Positive for nausea.  Neurological:  Positive for dizziness. Negative for headaches.      Objective  Vitals:   06/22/22 1037  BP: 125/66  Pulse: 66  SpO2: 97%  Weight: 144 lb (65.3 kg)  Height: '5\' 10"'$  (1.778 m)    Body mass index is 20.66 kg/m.  Physical Exam Vitals reviewed.  Constitutional:      General: He is awake.     Appearance: Normal appearance. He is  well-developed, well-groomed and normal weight.  HENT:     Head: Normocephalic and atraumatic.     Right Ear: Decreased hearing noted. There is impacted cerumen.     Left Ear: Decreased hearing noted. There is impacted cerumen.  Eyes:     General: Lids are normal. Gaze aligned appropriately.     Extraocular Movements: Extraocular movements intact.     Conjunctiva/sclera: Conjunctivae normal.  Cardiovascular:     Rate and Rhythm: Normal rate and regular rhythm.     Pulses: Normal pulses.          Radial pulses are 2+ on the right side and 2+ on the left side.     Heart sounds: Normal heart sounds.  Pulmonary:     Effort: Pulmonary effort is normal.     Breath sounds: Normal breath sounds. No decreased air movement. No decreased breath sounds, wheezing, rhonchi  or rales.  Musculoskeletal:     Right lower leg: No edema.     Left lower leg: No edema.  Neurological:     Mental Status: He is alert.  Psychiatric:        Attention and Perception: Attention and perception normal.        Mood and Affect: Mood and affect normal.        Speech: Speech normal.        Behavior: Behavior normal. Behavior is cooperative.      No results found for this or any previous visit (from the past 2160 hour(s)).   PHQ2/9:    06/22/2022   10:39 AM 12/29/2021    2:46 PM 11/29/2021   12:26 PM 08/08/2021    8:46 AM  Depression screen PHQ 2/9  Decreased Interest 0 0 0 0  Down, Depressed, Hopeless 0 0 0 0  PHQ - 2 Score 0 0 0 0  Altered sleeping 0     Tired, decreased energy 0     Change in appetite 0     Feeling bad or failure about yourself  0     Trouble concentrating 0     Moving slowly or fidgety/restless 0     Suicidal thoughts 0     PHQ-9 Score 0     Difficult doing work/chores Not difficult at all         Fall Risk:    06/22/2022   10:39 AM 12/29/2021    2:51 PM  Fall Risk   Falls in the past year? 0 0  Number falls in past yr: 0 0  Injury with Fall? 0 0  Risk for fall due to : No  Fall Risks No Fall Risks  Follow up Falls evaluation completed Falls evaluation completed      Functional Status Survey:      Assessment & Plan  Problem List Items Addressed This Visit       Other   Dizziness - Primary    Acute concern Reports several incidents of this over the past month, no falls or LOC  He is unsure of causes, PE is reassuring for ears, lungs, heart at this time Orthostatics performed- moderate changes which could account for dizziness noted Will check CBC and CMP for potential etiology as well- results to dictate further management  Recommend he stay well hydrated, eat regular snacks and meals with appropriate servings of protein, carbs, fats to prevent low blood glucose Reviewed dizziness safety precautions May need to evaluate medications for BP lowering if this continues  Follow up as needed      Relevant Orders   COMPLETE METABOLIC PANEL WITH GFR   CBC w/Diff/Platelet     No follow-ups on file.   I, [place] E Mackenna Kamer, PA-C, have reviewed all documentation for this visit. The documentation on 06/22/22 for the exam, diagnosis, procedures, and orders are all accurate and complete.   Talitha Givens, MHS, PA-C [facility]

## 2022-06-23 LAB — CBC WITH DIFFERENTIAL/PLATELET
Absolute Monocytes: 422 cells/uL (ref 200–950)
Basophils Absolute: 19 cells/uL (ref 0–200)
Basophils Relative: 0.3 %
Eosinophils Absolute: 309 cells/uL (ref 15–500)
Eosinophils Relative: 4.9 %
HCT: 44.5 % (ref 38.5–50.0)
Hemoglobin: 15 g/dL (ref 13.2–17.1)
Lymphs Abs: 1254 cells/uL (ref 850–3900)
MCH: 30.2 pg (ref 27.0–33.0)
MCHC: 33.7 g/dL (ref 32.0–36.0)
MCV: 89.5 fL (ref 80.0–100.0)
MPV: 11.1 fL (ref 7.5–12.5)
Monocytes Relative: 6.7 %
Neutro Abs: 4297 cells/uL (ref 1500–7800)
Neutrophils Relative %: 68.2 %
Platelets: 257 10*3/uL (ref 140–400)
RBC: 4.97 10*6/uL (ref 4.20–5.80)
RDW: 12.9 % (ref 11.0–15.0)
Total Lymphocyte: 19.9 %
WBC: 6.3 10*3/uL (ref 3.8–10.8)

## 2022-06-23 LAB — COMPLETE METABOLIC PANEL WITH GFR
AG Ratio: 1.9 (calc) (ref 1.0–2.5)
ALT: 16 U/L (ref 9–46)
AST: 22 U/L (ref 10–35)
Albumin: 4 g/dL (ref 3.6–5.1)
Alkaline phosphatase (APISO): 87 U/L (ref 35–144)
BUN: 17 mg/dL (ref 7–25)
CO2: 23 mmol/L (ref 20–32)
Calcium: 9.1 mg/dL (ref 8.6–10.3)
Chloride: 110 mmol/L (ref 98–110)
Creat: 1.09 mg/dL (ref 0.70–1.28)
Globulin: 2.1 g/dL (calc) (ref 1.9–3.7)
Glucose, Bld: 97 mg/dL (ref 65–99)
Potassium: 4.4 mmol/L (ref 3.5–5.3)
Sodium: 141 mmol/L (ref 135–146)
Total Bilirubin: 0.4 mg/dL (ref 0.2–1.2)
Total Protein: 6.1 g/dL (ref 6.1–8.1)
eGFR: 69 mL/min/{1.73_m2} (ref >=60)

## 2022-06-28 DIAGNOSIS — H52223 Regular astigmatism, bilateral: Secondary | ICD-10-CM | POA: Diagnosis not present

## 2022-06-28 DIAGNOSIS — H2513 Age-related nuclear cataract, bilateral: Secondary | ICD-10-CM | POA: Diagnosis not present

## 2022-06-28 DIAGNOSIS — H5203 Hypermetropia, bilateral: Secondary | ICD-10-CM | POA: Diagnosis not present

## 2022-06-28 DIAGNOSIS — H524 Presbyopia: Secondary | ICD-10-CM | POA: Diagnosis not present

## 2022-06-28 DIAGNOSIS — Z135 Encounter for screening for eye and ear disorders: Secondary | ICD-10-CM | POA: Diagnosis not present

## 2022-07-05 ENCOUNTER — Telehealth: Payer: Self-pay

## 2022-07-05 ENCOUNTER — Encounter: Payer: Self-pay | Admitting: Internal Medicine

## 2022-07-05 ENCOUNTER — Ambulatory Visit (INDEPENDENT_AMBULATORY_CARE_PROVIDER_SITE_OTHER): Payer: PPO | Admitting: Internal Medicine

## 2022-07-05 ENCOUNTER — Ambulatory Visit: Payer: PPO | Admitting: Internal Medicine

## 2022-07-05 VITALS — BP 106/64 | HR 88 | Temp 96.9°F | Wt 143.0 lb

## 2022-07-05 DIAGNOSIS — Z23 Encounter for immunization: Secondary | ICD-10-CM | POA: Diagnosis not present

## 2022-07-05 DIAGNOSIS — J410 Simple chronic bronchitis: Secondary | ICD-10-CM

## 2022-07-05 DIAGNOSIS — J00 Acute nasopharyngitis [common cold]: Secondary | ICD-10-CM | POA: Diagnosis not present

## 2022-07-05 MED ORDER — AZITHROMYCIN 250 MG PO TABS: ORAL_TABLET | ORAL | 0 refills | Status: DC

## 2022-07-05 MED ORDER — GUAIFENESIN-CODEINE 100-10 MG/5ML PO SYRP: 5.0000 mL | ORAL_SOLUTION | Freq: Four times a day (QID) | ORAL | 0 refills | Status: DC | PRN

## 2022-07-05 NOTE — Patient Instructions (Signed)

## 2022-07-05 NOTE — Progress Notes (Signed)
Subjective:    Patient ID: James [name], male    DOB: Nov 15, 1940, 81 y.o.   MRN: [id number]  HPI  Patient presents to clinic today  with c/o fatigue, headache, lightheadedness, cough and shortness of breath.  This started 2 weeks ago.  The headache is located all over his head.  He describes the pain as pressure.  The cough is productive of yellow/brown mucus.  He denies runny nose, nasal congestion, ear pain, sore throat, chest pain, nausea, vomiting or diarrhea.  He has run a fever but denies chills or body aches.  He has not had sick contacts that he is aware of. He did not take a covid test.  He has tried Tylenol OTC with minimal relief of symptoms.  He has a history of COPD and does continue to smoke.  Review of Systems     Past Medical History:  Diagnosis Date   Arthritis    Asthma    Baker's cyst of knee, right    BPH (benign prostatic hyperplasia)    Bronchitis    Carotid stenosis, left    Carpal tunnel syndrome, right    COPD (chronic obstructive pulmonary disease) (HCC)    Dysphagia    Elevated cholesterol    Erectile dysfunction    Gastritis and duodenitis    GERD (gastroesophageal reflux disease)    Neuropathy of right forearm    Schatzki's ring    Tendinitis    Vertigo, intermittent    Weight loss     Current Outpatient Medications  Medication Sig Dispense Refill   BREO ELLIPTA 100-25 MCG/ACT AEPB Inhale 1 puff into the lungs daily. 60 each 11   diclofenac Sodium (VOLTAREN) 1 % GEL diclofenac 1 % topical gel  APPLY TO AFFECTED AREA 4 TIMES A DAY     fluticasone (FLONASE) 50 MCG/ACT nasal spray Place 2 sprays into both nostrils daily. Use for 4-6 weeks then stop and use seasonally or as needed. 16 g 3   fluticasone furoate-vilanterol (BREO ELLIPTA) 100-25 MCG/ACT AEPB Breo Ellipta 100 mcg-25 mcg/dose powder for inhalation  INHALE 1 PUFF BY MOUTH EVERY DAY     gabapentin (NEURONTIN) 300 MG capsule      meclizine (ANTIVERT) 25 MG tablet Take 1 tablet (25 mg  total) by mouth 3 (three) times daily as needed for dizziness. 30 tablet 0   meloxicam (MOBIC) 7.5 MG tablet      memantine (NAMENDA) 5 MG tablet      minocycline (DYNACIN) 100 MG tablet      Multiple Vitamin (MULTIVITAMIN WITH MINERALS) TABS tablet Take 1 tablet by mouth daily. Centrum.     naproxen sodium (ANAPROX) 220 MG tablet Take 220-440 mg by mouth 2 (two) times daily as needed (for pain.).     omeprazole (PRILOSEC) 40 MG capsule Take 1 capsule (40 mg total) by mouth daily. 90 capsule 3   pravastatin (PRAVACHOL) 10 MG tablet Take 1 tablet (10 mg total) by mouth at bedtime. 90 tablet 3   PROAIR HFA 108 (90 Base) MCG/ACT inhaler Inhale 1-2 puffs into the lungs every 6 (six) hours as needed. For shortness of breath     tadalafil (CIALIS) 20 MG tablet Take 1 tablet (20 mg total) by mouth every other day as needed for erectile dysfunction. 45 tablet 11   terazosin (HYTRIN) 2 MG capsule Take 1 capsule (2 mg total) by mouth at bedtime. 90 capsule 3   No current facility-administered medications for this visit.    Allergies  Allergen Reactions   Aspirin Hives, Swelling and Other (See Comments)    Blisters    No family history on file.  Social History   Socioeconomic History   Marital status: Married    Spouse name: Not on file   Number of children: Not on file   Years of education: Not on file   Highest education level: Not on file  Occupational History   Not on file  Tobacco Use   Smoking status: Every Day    Packs/day: 0.25    Years: 50.00    Total pack years: 12.50    Types: Cigarettes   Smokeless tobacco: Never  Vaping Use   Vaping Use: Never used  Substance and Sexual Activity   Alcohol use: Yes    Alcohol/week: 3.0 standard drinks of alcohol    Types: 3 Cans of beer per week   Drug use: No   Sexual activity: Not on file  Other Topics Concern   Not on file  Social History Narrative   Not on file   Social Determinants of Health   Financial Resource Strain:  Low Risk  (12/29/2021)   Overall Financial Resource Strain (CARDIA)    Difficulty of Paying Living Expenses: Not hard at all  Food Insecurity: No Food Insecurity (12/29/2021)   Hunger Vital Sign    Worried About Running Out of Food in the Last Year: Never true    Ran Out of Food in the Last Year: Never true  Transportation Needs: No Transportation Needs (12/29/2021)   PRAPARE - Hydrologist (Medical): No    Lack of Transportation (Non-Medical): No  Physical Activity: Sufficiently Active (12/29/2021)   Exercise Vital Sign    Days of Exercise per Week: 5 days    Minutes of Exercise per Session: 60 min  Stress: No Stress Concern Present (12/29/2021)   [place]    Feeling of Stress : Not at all  Social Connections: Socially Isolated (12/29/2021)   Social Connection and Isolation Panel [NHANES]    Frequency of Communication with Friends and Family: Twice a week    Frequency of Social Gatherings with Friends and Family: Once a week    Attends Religious Services: Never    Marine scientist or Organizations: No    Attends Archivist Meetings: Never    Marital Status: Widowed  Intimate Partner Violence: Not At Risk (12/29/2021)   Humiliation, Afraid, Rape, and Kick questionnaire    Fear of Current or Ex-Partner: No    Emotionally Abused: No    Physically Abused: No    Sexually Abused: No     Constitutional: Patient reports fatigue, headache.  Denies fever, malaise, or abrupt weight changes.  HEENT: Denies eye pain, eye redness, ear pain, ringing in the ears, wax buildup, runny nose, nasal congestion, bloody nose, or sore throat. Respiratory: Patient reports cough and shortness of breath.  Denies difficulty breathing.   Cardiovascular: Denies chest pain, chest tightness, palpitations or swelling in the hands or feet.  Gastrointestinal: Denies abdominal pain, bloating, constipation, diarrhea  or blood in the stool.  Neurological: Patient reports lightheadedness.  Denies difficulty with memory, difficulty with speech or problems with balance and coordination.    No other specific complaints in a complete review of systems (except as listed in HPI above).  Objective:   Physical Exam  BP 106/64 (BP Location: Left Arm, Patient Position: Sitting, Cuff Size: Normal)   Pulse  88   Temp (!) 96.9 F (36.1 C) (Temporal)   Wt 143 lb (64.9 kg)   SpO2 96%   BMI 20.52 kg/m   Wt Readings from Last 3 Encounters:  06/22/22 144 lb (65.3 kg)  05/31/22 142 lb 6.4 oz (64.6 kg)  01/29/22 145 lb (65.8 kg)    General: Appears his stated age, appears unwell but in NAD. HEENT: Head: normal shape and size; Eyes: sclera white, no icterus, conjunctiva pink, PERRLA and EOMs intact; Ears: bilateral cerumen impaction; Throat/Mouth: Teeth present, mucosa pink and moist, + PND, no exudate, lesions or ulcerations noted.  Neck: No adenopathy noted Cardiovascular: Normal rate and rhythm. Pulmonary/Chest: Normal effort and positive vesicular breath sounds. No respiratory distress. No wheezes, rales or ronchi noted.  Neurological: Alert and oriented.   BMET    Component Value Date/Time   NA 141 06/22/2022 1121   K 4.4 06/22/2022 1121   CL 110 06/22/2022 1121   CO2 23 06/22/2022 1121   GLUCOSE 97 06/22/2022 1121   BUN 17 06/22/2022 1121   CREATININE 1.09 06/22/2022 1121   CALCIUM 9.1 06/22/2022 1121   GFRNONAA >60 12/12/2018 1021   GFRAA >60 12/12/2018 1021    Lipid Panel     Component Value Date/Time   CHOL 169 10/12/2021 1031   TRIG 63 10/12/2021 1031   HDL 43 10/12/2021 1031   CHOLHDL 3.9 10/12/2021 1031   LDLCALC 111 (H) 10/12/2021 1031    CBC    Component Value Date/Time   WBC 6.3 06/22/2022 1121   RBC 4.97 06/22/2022 1121   HGB 15.0 06/22/2022 1121   HCT 44.5 06/22/2022 1121   PLT 257 06/22/2022 1121   MCV 89.5 06/22/2022 1121   MCH 30.2 06/22/2022 1121   MCHC 33.7  06/22/2022 1121   RDW 12.9 06/22/2022 1121   LYMPHSABS 1,254 06/22/2022 1121   MONOABS 0.4 12/12/2018 1021   EOSABS 309 06/22/2022 1121   BASOSABS 19 06/22/2022 1121    Hgb A1C Lab Results  Component Value Date   HGBA1C 5.4 10/12/2021           Assessment & Plan:   Upper Respiratory Infection, COPD:  No indication to test for flu/COVID/RSV given the fact that he has been having symptoms for 2 weeks Advised him to continue Breo and Albuterol as prescribed Rx for Azithromycin 250 mg x 5 days Rx Robitussin-AC cough syrup as needed for cough  Follow-up with your PCP as previously indicated Webb Silversmith, NP

## 2022-07-05 NOTE — Telephone Encounter (Signed)
Pt's daughter, Lovena Le was calling, didn't see DPR on file so obtained permission from pt. She was asking why pt was put on Azithromycin and what kind of shot he received today since he didn't get an AVS sheet. I advised her OV note from today and that pt got flu shot. She states normally he comes home with AVS but didn't know where left it. I seen that it was declined at [facility]. Lovena Le didn't have any further questions.

## 2022-07-10 DIAGNOSIS — R42 Dizziness and giddiness: Secondary | ICD-10-CM | POA: Diagnosis not present

## 2022-07-10 DIAGNOSIS — H903 Sensorineural hearing loss, bilateral: Secondary | ICD-10-CM | POA: Diagnosis not present

## 2022-07-10 DIAGNOSIS — H6123 Impacted cerumen, bilateral: Secondary | ICD-10-CM | POA: Diagnosis not present

## 2022-08-10 DIAGNOSIS — L570 Actinic keratosis: Secondary | ICD-10-CM | POA: Diagnosis not present

## 2022-08-10 DIAGNOSIS — X32XXXA Exposure to sunlight, initial encounter: Secondary | ICD-10-CM | POA: Diagnosis not present

## 2022-08-10 DIAGNOSIS — D485 Neoplasm of uncertain behavior of skin: Secondary | ICD-10-CM | POA: Diagnosis not present

## 2022-08-10 DIAGNOSIS — Z85828 Personal history of other malignant neoplasm of skin: Secondary | ICD-10-CM | POA: Diagnosis not present

## 2022-08-10 DIAGNOSIS — Z08 Encounter for follow-up examination after completed treatment for malignant neoplasm: Secondary | ICD-10-CM | POA: Diagnosis not present

## 2022-08-10 DIAGNOSIS — D0461 Carcinoma in situ of skin of right upper limb, including shoulder: Secondary | ICD-10-CM | POA: Diagnosis not present

## 2022-08-10 DIAGNOSIS — L821 Other seborrheic keratosis: Secondary | ICD-10-CM | POA: Diagnosis not present

## 2022-08-10 DIAGNOSIS — D229 Melanocytic nevi, unspecified: Secondary | ICD-10-CM | POA: Diagnosis not present

## 2022-08-17 DIAGNOSIS — D0461 Carcinoma in situ of skin of right upper limb, including shoulder: Secondary | ICD-10-CM | POA: Diagnosis not present

## 2022-10-07 ENCOUNTER — Other Ambulatory Visit: Payer: Self-pay | Admitting: Family Medicine

## 2022-10-07 DIAGNOSIS — K219 Gastro-esophageal reflux disease without esophagitis: Secondary | ICD-10-CM

## 2022-10-08 NOTE — Telephone Encounter (Signed)
Requested Prescriptions  Pending Prescriptions Disp Refills   omeprazole (PRILOSEC) 40 MG capsule [Pharmacy Med Name: OMEPRAZOLE DR 40 MG CAPSULE] 90 capsule 2    Sig: TAKE 1 CAPSULE (40 MG TOTAL) BY MOUTH DAILY.     Gastroenterology: Proton Pump Inhibitors Passed - 10/07/2022  8:48 AM      Passed - Valid encounter within last 12 months    Recent Outpatient Visits           3 months ago Acute nasopharyngitis   [facility] [place], Coralie Keens, NP   3 months ago [facility] [place], [place], [place]   4 months ago Centrilobular emphysema [facility])   [place], DO   8 months ago Centrilobular emphysema [facility])   [place], DO   10 months ago Centrilobular emphysema [facility])   [place], DO

## 2022-10-15 ENCOUNTER — Other Ambulatory Visit: Payer: Self-pay | Admitting: Family Medicine

## 2022-10-15 DIAGNOSIS — J432 Centrilobular emphysema: Secondary | ICD-10-CM

## 2022-10-15 NOTE — Telephone Encounter (Addendum)
Medication Refill : BREO ELLIPTA 100-25 MCG/ACT AEPB  Has the patient contacted their pharmacy? No.  Patient states that he is needing the refill before he leaves to go to [place] on Wednesday 10/17/2022 for Christmas. Patient states that he will call back to make an appoint when he gets back in town.   (Preferred Pharmacy (with phone number or street name):  [id number] [place] [place]: [phone] Fax: [phone]    Has the patient been seen for an appointment in the last year OR does the patient have an upcoming appointment? Yes.    Agent: Please be advised that RX refills may take up to 3 business days. We ask that you follow-up with your pharmacy.

## 2022-10-16 NOTE — Telephone Encounter (Signed)
Unable to refill per protocol, request is too soon. Last refill 10/10/21 for 60 and 11 refills. Will refuse.  Requested Prescriptions  Pending Prescriptions Disp Refills   BREO ELLIPTA 100-25 MCG/ACT AEPB 60 each 11    Sig: Inhale 1 puff into the lungs daily.     Pulmonology:  Combination Products Passed - 10/15/2022 12:10 PM      Passed - Valid encounter within last 12 months    Recent Outpatient Visits           3 months ago Acute nasopharyngitis   [facility] [place], Coralie Keens, NP   3 months ago [facility] [place], [place], [place]   4 months ago Centrilobular emphysema [facility])   [place], DO   8 months ago Centrilobular emphysema [facility])   [place], DO   10 months ago Centrilobular emphysema [facility])   [place], DO

## 2022-10-18 NOTE — Telephone Encounter (Addendum)
Patient's daughter is calling - medication was refused- too soon- Rx last filled 10/10/21- 1 year supply- can that be reviewed? Patient is going out of town for holiday.

## 2022-11-02 ENCOUNTER — Other Ambulatory Visit: Payer: Self-pay | Admitting: Family Medicine

## 2022-11-02 DIAGNOSIS — N401 Enlarged prostate with lower urinary tract symptoms: Secondary | ICD-10-CM

## 2022-11-02 NOTE — Telephone Encounter (Signed)
Requested Prescriptions  Pending Prescriptions Disp Refills   doxazosin (CARDURA) 2 MG tablet [Pharmacy Med Name: DOXAZOSIN MESYLATE 2 MG TAB] 180 tablet 1    Sig: TAKE 2 TABLETS BY MOUTH DAILY.     Cardiovascular:  Alpha Blockers Passed - 11/02/2022  1:31 AM      Passed - Last BP in normal range    BP Readings from Last 1 Encounters:  07/05/22 106/64         Passed - Valid encounter within last 6 months    Recent Outpatient Visits           4 months ago Acute nasopharyngitis   [facility] [place], Coralie Keens, NP   4 months ago Dizziness   [facility], [place], [place]   5 months ago Centrilobular emphysema [facility])   [facility] Hauser, DO   9 months ago Centrilobular emphysema [facility])   [place], DO   11 months ago Centrilobular emphysema [facility])   [place], [place]

## 2022-11-09 ENCOUNTER — Ambulatory Visit (INDEPENDENT_AMBULATORY_CARE_PROVIDER_SITE_OTHER): Payer: PPO | Admitting: Family Medicine

## 2022-11-09 ENCOUNTER — Encounter: Payer: Self-pay | Admitting: Family Medicine

## 2022-11-09 VITALS — BP 138/86 | HR 100 | Ht 70.0 in | Wt 143.8 lb

## 2022-11-09 DIAGNOSIS — R3914 Feeling of incomplete bladder emptying: Secondary | ICD-10-CM

## 2022-11-09 DIAGNOSIS — E7800 Pure hypercholesterolemia, unspecified: Secondary | ICD-10-CM | POA: Diagnosis not present

## 2022-11-09 DIAGNOSIS — J432 Centrilobular emphysema: Secondary | ICD-10-CM | POA: Diagnosis not present

## 2022-11-09 DIAGNOSIS — G8929 Other chronic pain: Secondary | ICD-10-CM

## 2022-11-09 DIAGNOSIS — N401 Enlarged prostate with lower urinary tract symptoms: Secondary | ICD-10-CM | POA: Diagnosis not present

## 2022-11-09 DIAGNOSIS — M545 Low back pain, unspecified: Secondary | ICD-10-CM

## 2022-11-09 MED ORDER — DOXAZOSIN MESYLATE 4 MG PO TABS: 4.0000 mg | ORAL_TABLET | Freq: Every day | ORAL | 3 refills | Status: DC

## 2022-11-09 NOTE — Progress Notes (Signed)
Subjective:    Patient ID: James [name], male    DOB: 1940-12-19, 82 y.o.   MRN: [id number]  James [name] is a 82 y.o. male presenting on 11/09/2022 for Hypertension   HPI  BPH LUTS He takes Doxazosin '2mg'$  x 2 = '4mg'$  daily in AM. He has difficulty starting urination. He [facility] - [facility] [place]    Centrilobular Emphysema Tobacco Abuse Active smoker, quarter pack per day 6-7 per day He can do what he needs to do daily activities On Breo inhaler daily, Albuterol AS NEEDED  Chronic Low Back Pain Interested in X-ray in future, has had episodic flare low back pain without sciatica, he has had years of back pain.   GERD Controlled on Omeprazole '40mg'$  daily. Has refills, doing well.      06/22/2022   10:39 AM 12/29/2021    2:46 PM 11/29/2021   12:26 PM  Depression screen PHQ 2/9  Decreased Interest 0 0 0  Down, Depressed, Hopeless 0 0 0  PHQ - 2 Score 0 0 0  Altered sleeping 0    Tired, decreased energy 0    Change in appetite 0    Feeling bad or failure about yourself  0    Trouble concentrating 0    Moving slowly or fidgety/restless 0    Suicidal thoughts 0    PHQ-9 Score 0    Difficult doing work/chores Not difficult at all      Past Medical History:  Diagnosis Date   Arthritis    Asthma    Baker's cyst of knee, right    BPH (benign prostatic hyperplasia)    Bronchitis    Carotid stenosis, left    Carpal tunnel syndrome, right    COPD (chronic obstructive pulmonary disease) (HCC)    Dysphagia    Elevated cholesterol    Erectile dysfunction    Gastritis and duodenitis    GERD (gastroesophageal reflux disease)    Neuropathy of right forearm    Schatzki's ring    Tendinitis    Vertigo, intermittent    Weight loss    Past Surgical History:  Procedure Laterality Date   BALLOON DILATION N/A 12/17/2018   Procedure: BALLOON DILATION;  Surgeon: Manya Silvas, MD;  Location: [facility] ENDOSCOPY;  Service: Endoscopy;   Laterality: N/A;   COLONOSCOPY WITH ESOPHAGOGASTRODUODENOSCOPY (EGD) AND ESOPHAGEAL DILATION (ED)     ESOPHAGOGASTRODUODENOSCOPY (EGD) WITH PROPOFOL N/A 10/26/2016   Procedure: ESOPHAGOGASTRODUODENOSCOPY (EGD) WITH PROPOFOL;  Surgeon: Manya Silvas, MD;  Location: [facility] ENDOSCOPY;  Service: Endoscopy;  Laterality: N/A;   ESOPHAGOGASTRODUODENOSCOPY (EGD) WITH PROPOFOL N/A 12/17/2018   Procedure: ESOPHAGOGASTRODUODENOSCOPY (EGD) WITH PROPOFOL;  Surgeon: Manya Silvas, MD;  Location: [facility] ENDOSCOPY;  Service: Endoscopy;  Laterality: N/A;   HERNIA REPAIR     SAVORY DILATION N/A 10/26/2016   Procedure: SAVORY DILATION;  Surgeon: Manya Silvas, MD;  Location: [facility] ENDOSCOPY;  Service: Endoscopy;  Laterality: N/A;   Social History   Socioeconomic History   Marital status: Married    Spouse name: Not on file   Number of children: Not on file   Years of education: Not on file   Highest education level: Not on file  Occupational History   Not on file  Tobacco Use   Smoking status: Every Day    Packs/day: 0.25    Years: 50.00    Total pack years: 12.50    Types: Cigarettes   Smokeless tobacco: Never  Vaping Use   Vaping Use: Never used  Substance and Sexual Activity   Alcohol use: Yes    Alcohol/week: 3.0 standard drinks of alcohol    Types: 3 Cans of beer per week   Drug use: No   Sexual activity: Not on file  Other Topics Concern   Not on file  Social History Narrative   Not on file   Social Determinants of Health   Financial Resource Strain: Low Risk  (12/29/2021)   Overall Financial Resource Strain (CARDIA)    Difficulty of Paying Living Expenses: Not hard at all  Food Insecurity: No Food Insecurity (12/29/2021)   Hunger Vital Sign    Worried About Running Out of Food in the Last Year: Never true    Ran Out of Food in the Last Year: Never true  Transportation Needs: No Transportation Needs (12/29/2021)   PRAPARE - Hydrologist (Medical): No     Lack of Transportation (Non-Medical): No  Physical Activity: Sufficiently Active (12/29/2021)   Exercise Vital Sign    Days of Exercise per Week: 5 days    Minutes of Exercise per Session: 60 min  Stress: No Stress Concern Present (12/29/2021)   [place]    Feeling of Stress : Not at all  Social Connections: Socially Isolated (12/29/2021)   Social Connection and Isolation Panel [NHANES]    Frequency of Communication with Friends and Family: Twice a week    Frequency of Social Gatherings with Friends and Family: Once a week    Attends Religious Services: Never    Marine scientist or Organizations: No    Attends Archivist Meetings: Never    Marital Status: Widowed  Intimate Partner Violence: Not At Risk (12/29/2021)   Humiliation, Afraid, Rape, and Kick questionnaire    Fear of Current or Ex-Partner: No    Emotionally Abused: No    Physically Abused: No    Sexually Abused: No   History reviewed. No pertinent family history. Current Outpatient Medications on File Prior to Visit  Medication Sig   BREO ELLIPTA 100-25 MCG/ACT AEPB Inhale 1 puff into the lungs daily.   diclofenac Sodium (VOLTAREN) 1 % GEL diclofenac 1 % topical gel  APPLY TO AFFECTED AREA 4 TIMES A DAY   fluticasone (FLONASE) 50 MCG/ACT nasal spray Place 2 sprays into both nostrils daily. Use for 4-6 weeks then stop and use seasonally or as needed.   fluticasone furoate-vilanterol (BREO ELLIPTA) 100-25 MCG/ACT AEPB Breo Ellipta 100 mcg-25 mcg/dose powder for inhalation  INHALE 1 PUFF BY MOUTH EVERY DAY   gabapentin (NEURONTIN) 300 MG capsule    meclizine (ANTIVERT) 25 MG tablet Take 1 tablet (25 mg total) by mouth 3 (three) times daily as needed for dizziness.   meloxicam (MOBIC) 7.5 MG tablet    memantine (NAMENDA) 5 MG tablet    minocycline (DYNACIN) 100 MG tablet    Multiple Vitamin (MULTIVITAMIN WITH MINERALS) TABS tablet Take 1 tablet by  mouth daily. Centrum.   naproxen sodium (ANAPROX) 220 MG tablet Take 220-440 mg by mouth 2 (two) times daily as needed (for pain.).   omeprazole (PRILOSEC) 40 MG capsule TAKE 1 CAPSULE (40 MG TOTAL) BY MOUTH DAILY.   pravastatin (PRAVACHOL) 10 MG tablet Take 1 tablet (10 mg total) by mouth at bedtime.   PROAIR HFA 108 (90 Base) MCG/ACT inhaler Inhale 1-2 puffs into the lungs every 6 (six) hours as needed. For  shortness of breath   tadalafil (CIALIS) 20 MG tablet Take 1 tablet (20 mg total) by mouth every other day as needed for erectile dysfunction.   No current facility-administered medications on file prior to visit.    Review of Systems Per HPI unless specifically indicated above      Objective:    BP 138/86   Pulse 100   Ht '5\' 10"'$  (1.778 m)   Wt 143 lb 12.8 oz (65.2 kg)   SpO2 99%   BMI 20.63 kg/m   Wt Readings from Last 3 Encounters:  11/09/22 143 lb 12.8 oz (65.2 kg)  07/05/22 143 lb (64.9 kg)  06/22/22 144 lb (65.3 kg)    Physical Exam Vitals and nursing note reviewed.  Constitutional:      General: He is not in acute distress.    Appearance: He is well-developed. He is not diaphoretic.     Comments: Well-appearing, comfortable, cooperative  HENT:     Head: Normocephalic and atraumatic.  Eyes:     General:        Right eye: No discharge.        Left eye: No discharge.     Conjunctiva/sclera: Conjunctivae normal.  Neck:     Thyroid: No thyromegaly.  Cardiovascular:     Rate and Rhythm: Normal rate and regular rhythm.     Pulses: Normal pulses.     Heart sounds: Normal heart sounds. No murmur heard. Pulmonary:     Effort: Pulmonary effort is normal. No respiratory distress.     Breath sounds: Normal breath sounds. No wheezing or rales.  Musculoskeletal:        General: Normal range of motion.     Cervical back: Normal range of motion and neck supple.  Lymphadenopathy:     Cervical: No cervical adenopathy.  Skin:    General: Skin is warm and dry.      Findings: No erythema or rash.  Neurological:     Mental Status: He is alert and oriented to person, place, and time. Mental status is at baseline.  Psychiatric:        Behavior: Behavior normal.     Comments: Well groomed, good eye contact, normal speech and thoughts       Results for orders placed or performed in visit on 06/22/22  COMPLETE METABOLIC PANEL WITH GFR  Result Value Ref Range   Glucose, Bld 97 65 - 99 mg/dL   BUN 17 7 - 25 mg/dL   Creat 1.09 0.70 - 1.28 mg/dL   eGFR 69 > OR = 60 mL/min/1.42m   BUN/Creatinine Ratio SEE NOTE: 6 - 22 (calc)   Sodium 141 135 - 146 mmol/L   Potassium 4.4 3.5 - 5.3 mmol/L   Chloride 110 98 - 110 mmol/L   CO2 23 20 - 32 mmol/L   Calcium 9.1 8.6 - 10.3 mg/dL   Total Protein 6.1 6.1 - 8.1 g/dL   Albumin 4.0 3.6 - 5.1 g/dL   Globulin 2.1 1.9 - 3.7 g/dL (calc)   AG Ratio 1.9 1.0 - 2.5 (calc)   Total Bilirubin 0.4 0.2 - 1.2 mg/dL   Alkaline phosphatase (APISO) 87 35 - 144 U/L   AST 22 10 - 35 U/L   ALT 16 9 - 46 U/L  CBC w/Diff/Platelet  Result Value Ref Range   WBC 6.3 3.8 - 10.8 Thousand/uL   RBC 4.97 4.20 - 5.80 Million/uL   Hemoglobin 15.0 13.2 - 17.1 g/dL   HCT 44.5 38.5 -  50.0 %   MCV 89.5 80.0 - 100.0 fL   MCH 30.2 27.0 - 33.0 pg   MCHC 33.7 32.0 - 36.0 g/dL   RDW 12.9 11.0 - 15.0 %   Platelets 257 140 - 400 Thousand/uL   MPV 11.1 7.5 - 12.5 fL   Neutro Abs 4,297 1,500 - 7,800 cells/uL   Lymphs Abs 1,254 850 - 3,900 cells/uL   Absolute Monocytes 422 200 - 950 cells/uL   Eosinophils Absolute 309 15 - 500 cells/uL   Basophils Absolute 19 0 - 200 cells/uL   Neutrophils Relative % 68.2 %   Total Lymphocyte 19.9 %   Monocytes Relative 6.7 %   Eosinophils Relative 4.9 %   Basophils Relative 0.3 %      Assessment & Plan:   Problem List Items Addressed This Visit     Benign prostatic hyperplasia with incomplete bladder emptying - Primary   Relevant Medications   doxazosin (CARDURA) 4 MG tablet   Centrilobular  emphysema (HCC)   Low back pain   Relevant Orders   DG Lumbar Spine Complete   Pure hypercholesterolemia   Relevant Medications   doxazosin (CARDURA) 4 MG tablet    Updated Health Maintenance information Reviewed recent lab results with patient Encouraged improvement to lifestyle with diet and exercise Goal of weight loss  Switch Doxazosin from '2mg'$  x 2 = '4mg'$  daily in morning Switch to Doxazosin same med - but HIGHER dose '4mg'$  ONE Pill only can take later in the morning to see if it lasts longer.  Chronic Low Back Pain Underlying arthritis Recommend Lumbar X-ray upcoming  Orders Placed This Encounter  Procedures   DG Lumbar Spine Complete    Standing Status:   Future    Standing Expiration Date:   11/10/2023    Order Specific Question:   Reason for Exam (SYMPTOM  OR DIAGNOSIS REQUIRED)    Answer:   chronic low back pain without sciatica    Order Specific Question:   Preferred imaging location?    Answer:   [facility]     Meds ordered this encounter  Medications   doxazosin (CARDURA) 4 MG tablet    Sig: Take 1 tablet (4 mg total) by mouth daily.    Dispense:  90 tablet    Refill:  3    Dose change      Follow up plan: Return in about 4 weeks (around 12/07/2022) for 4 weeks - re-schedule Annual Physical AM apt fasting lab after.  Nobie Putnam, [facility] 11/09/2022, 11:07 AM

## 2022-11-09 NOTE — Patient Instructions (Addendum)
Thank you for coming to the office today.  Switch Doxazosin from '2mg'$  x 2 = '4mg'$  daily in morning Switch to Doxazosin same med - but HIGHER dose '4mg'$  ONE Pill only can take later in the morning to see if it lasts longer.   Return anytime Mon - Thurs for walk in, no apt needed, X-ray - of the low back. 8am to 11am and 1pm to 4pm. Not on Friday.  DUE for FASTING BLOOD WORK (no food or drink after midnight before the lab appointment, only water or coffee without cream/sugar on the morning of)  4 WEEKS    Please schedule a Follow-up Appointment to: Return in about 4 weeks (around 12/07/2022) for 4 weeks - re-schedule Annual Physical AM apt fasting lab after.  If you have any other questions or concerns, please feel free to call the office or send a message through [place]. You may also schedule an earlier appointment if necessary.  Additionally, you may be receiving a survey about your experience at our office within a few days to 1 week by e-mail or mail. We value your feedback.  Nobie Putnam, DO [place]

## 2022-11-26 ENCOUNTER — Encounter: Payer: Self-pay | Admitting: *Deleted

## 2022-12-12 ENCOUNTER — Ambulatory Visit
Admission: RE | Admit: 2022-12-12 | Discharge: 2022-12-12 | Disposition: A | Discharge status: Home / Self Care | Payer: PPO | Attending: Family Medicine | Admitting: Family Medicine

## 2022-12-12 ENCOUNTER — Ambulatory Visit (INDEPENDENT_AMBULATORY_CARE_PROVIDER_SITE_OTHER): Payer: PPO | Admitting: Family Medicine

## 2022-12-12 ENCOUNTER — Ambulatory Visit
Admission: RE | Admit: 2022-12-12 | Discharge: 2022-12-12 | Disposition: A | Discharge status: Home / Self Care | Payer: PPO | Source: Ambulatory Visit | Attending: Family Medicine | Admitting: Family Medicine

## 2022-12-12 ENCOUNTER — Telehealth: Payer: Self-pay

## 2022-12-12 ENCOUNTER — Telehealth: Payer: Self-pay | Admitting: Family Medicine

## 2022-12-12 ENCOUNTER — Other Ambulatory Visit: Payer: Self-pay | Admitting: Family Medicine

## 2022-12-12 VITALS — BP 120/64 | HR 75 | Ht 70.0 in | Wt 145.0 lb

## 2022-12-12 DIAGNOSIS — G8929 Other chronic pain: Secondary | ICD-10-CM

## 2022-12-12 DIAGNOSIS — R7309 Other abnormal glucose: Secondary | ICD-10-CM

## 2022-12-12 DIAGNOSIS — R3911 Hesitancy of micturition: Secondary | ICD-10-CM | POA: Diagnosis not present

## 2022-12-12 DIAGNOSIS — J432 Centrilobular emphysema: Secondary | ICD-10-CM

## 2022-12-12 DIAGNOSIS — N401 Enlarged prostate with lower urinary tract symptoms: Secondary | ICD-10-CM

## 2022-12-12 DIAGNOSIS — R3914 Feeling of incomplete bladder emptying: Secondary | ICD-10-CM | POA: Diagnosis not present

## 2022-12-12 DIAGNOSIS — Z0000 Encounter for general adult medical examination without abnormal findings: Secondary | ICD-10-CM

## 2022-12-12 DIAGNOSIS — M545 Low back pain, unspecified: Secondary | ICD-10-CM

## 2022-12-12 DIAGNOSIS — E7800 Pure hypercholesterolemia, unspecified: Secondary | ICD-10-CM

## 2022-12-12 MED ORDER — PROAIR HFA 108 (90 BASE) MCG/ACT IN AERS: 1.0000 | INHALATION_SPRAY | Freq: Four times a day (QID) | RESPIRATORY_TRACT | 12 refills | Status: DC | PRN

## 2022-12-12 MED ORDER — BREO ELLIPTA 100-25 MCG/ACT IN AEPB: 1.0000 | INHALATION_SPRAY | Freq: Every day | RESPIRATORY_TRACT | 12 refills | Status: DC

## 2022-12-12 NOTE — Telephone Encounter (Signed)
Daughter seeking clarification regarding "abnormal glucose" on AVS. Advised will forward question to provider.

## 2022-12-12 NOTE — Assessment & Plan Note (Signed)
Mostly controlled Continues on alpha blocker Doxazosin 19m, now higher dose and taking later in morning/day Also on Saw Palmetto now as well.

## 2022-12-12 NOTE — Patient Instructions (Addendum)
Thank you for coming to the office today.  Back x-ray today, we will try to call you with results.  Labs today, if we cannot reach you, will mail the results.  Keep taking Doxazosin 27m daily later in day. Keep on [place] Continue taking Gabapentin 3044mdaily, this is for nerve pain and would help the Right groin pain. It is likely nerve related.  Let me know if running low or out of any medication.  Please schedule a Follow-up Appointment to: Return in about 6 months (around 06/12/2023) for 6 month follow-up COPD, BPH, Back Pain.  If you have any other questions or concerns, please feel free to call the office or send a message through [facility] may also schedule an earlier appointment if necessary.  Additionally, you may be receiving a survey about your experience at our office within a few days to 1 week by e-mail or mail. We value your feedback.  AlNobie PutnamDO [place]

## 2022-12-12 NOTE — Telephone Encounter (Signed)
Pts daughter called to speak with Dr. Raliegh Ip about the AVS stating to continue taking Gabapentin (NEURONTIN) 300 MG capsule [id number]   / she asked if this can be sent to pharmacy  [id number]- [place], [place]- [address]    / he doesn't have that RX/ she hasn't know of him taking it / please advise

## 2022-12-12 NOTE — Telephone Encounter (Signed)
Requested medication (s) are due for refill today: yes  Requested medication (s) are on the active medication list: yes  Last refill:  12/12/22  Future visit scheduled: yes  Notes to clinic:    Pharmacy comment: Alternative Requested:BRAND NAME PROAIR IS NO LONGER MADE.       Requested Prescriptions  Pending Prescriptions Disp Refills   albuterol (VENTOLIN HFA) 108 (90 Base) MCG/ACT inhaler [Pharmacy Med Name: ALBUTEROL HFA (PROAIR) INHALER]  0     Pulmonology:  Beta Agonists 2 Passed - 12/12/2022  9:25 AM      Passed - Last BP in normal range    BP Readings from Last 1 Encounters:  12/12/22 120/64         Passed - Last Heart Rate in normal range    Pulse Readings from Last 1 Encounters:  12/12/22 75         Passed - Valid encounter within last 12 months    Recent Outpatient Visits           Today Annual physical exam   [facility] [place], Devonne Doughty, DO   1 month ago Benign prostatic hyperplasia with incomplete bladder emptying   [facility] [place], Devonne Doughty, DO   5 months ago Acute nasopharyngitis   [facility] [place], Coralie Keens, NP   5 months ago [place], [place]   6 months ago Centrilobular emphysema [facility])   [place], [place]

## 2022-12-12 NOTE — Assessment & Plan Note (Signed)
Previously controlled On Pravastatin 37mm daily

## 2022-12-12 NOTE — Progress Notes (Signed)
Subjective:    Patient ID: James [name], male    DOB: [birth year], 82 y.o.   MRN: [id number]  James [name] is a 82 y.o. male presenting on 12/12/2022 for Annual Exam   HPI  Here for Annual Physical and Lab Orders  BPH LUTS Follow up 1 month ago 11/09/22, he was taking Doxazosin from 59m x 2 = 437mdaily in morning Last visit we switched to Doxazosin same med - but HIGHER dose 85m76mne tablet, and instructed him to take later in morning or day so it would last longer and be effective in evening. His urination has improved some Taking OTC Saw Palmetto with some relief    Centrilobular Emphysema Tobacco Abuse Active smoker, quarter pack per day 6-7 per day He can do what he needs to do daily activities On Breo inhaler daily, Albuterol AS NEEDED He admits some days skips Breo Needs re order on both  Right Groin Pain Episodic nerve pain R groin, history of hernia repair.   Chronic Low Back Pain Interested in X-ray in future, has had episodic flare low back pain without sciatica, he has had years of back pain arthritis. He is requesting imaging.   GERD Controlled on Omeprazole 76m6mily. Has refills, doing well.   Health Maintenance: UTD on vaccines. Not updated on shingles vaccine.     06/22/2022   10:39 AM 12/29/2021    2:46 PM 11/29/2021   12:26 PM  Depression screen PHQ 2/9  Decreased Interest 0 0 0  Down, Depressed, Hopeless 0 0 0  PHQ - 2 Score 0 0 0  Altered sleeping 0    Tired, decreased energy 0    Change in appetite 0    Feeling bad or failure about yourself  0    Trouble concentrating 0    Moving slowly or fidgety/restless 0    Suicidal thoughts 0    PHQ-9 Score 0    Difficult doing work/chores Not difficult at all      Social History   Tobacco Use   Smoking status: Every Day    Packs/day: 0.25    Years: 50.00    Total pack years: 12.50    Types: Cigarettes   Smokeless tobacco: Never  Vaping Use   Vaping Use: Never used  Substance Use  Topics   Alcohol use: Yes    Alcohol/week: 3.0 standard drinks of alcohol    Types: 3 Cans of beer per week   Drug use: No    Review of Systems  Constitutional:  Negative for activity change, appetite change, chills, diaphoresis, fatigue and fever.  HENT:  Negative for congestion and hearing loss.   Eyes:  Negative for visual disturbance.  Respiratory:  Negative for cough, chest tightness, shortness of breath and wheezing.   Cardiovascular:  Negative for chest pain, palpitations and leg swelling.  Gastrointestinal:  Negative for abdominal pain, constipation, diarrhea, nausea and vomiting.  Genitourinary:  Negative for dysuria, frequency and hematuria.  Musculoskeletal:  Negative for arthralgias and neck pain.  Skin:  Negative for rash.  Neurological:  Negative for dizziness, weakness, light-headedness, numbness and headaches.  Hematological:  Negative for adenopathy.  Psychiatric/Behavioral:  Negative for behavioral problems, dysphoric mood and sleep disturbance.    Per HPI unless specifically indicated above     Objective:    BP 120/64   Pulse 75   Ht 5' 10"$  (1.778 m)   Wt 145 lb (65.8 kg)   SpO2 100%   BMI 20.81  kg/m   Wt Readings from Last 3 Encounters:  12/12/22 145 lb (65.8 kg)  11/09/22 143 lb 12.8 oz (65.2 kg)  07/05/22 143 lb (64.9 kg)    Physical Exam Vitals and nursing note reviewed.  Constitutional:      General: He is not in acute distress.    Appearance: He is well-developed. He is not diaphoretic.     Comments: Well-appearing, comfortable, cooperative  HENT:     Head: Normocephalic and atraumatic.  Eyes:     General:        Right eye: No discharge.        Left eye: No discharge.     Conjunctiva/sclera: Conjunctivae normal.     Pupils: Pupils are equal, round, and reactive to light.  Neck:     Thyroid: No thyromegaly.     Vascular: No carotid bruit.  Cardiovascular:     Rate and Rhythm: Normal rate and regular rhythm.     Pulses: Normal pulses.      Heart sounds: Normal heart sounds. No murmur heard. Pulmonary:     Effort: Pulmonary effort is normal. No respiratory distress.     Breath sounds: Wheezing present. No rales.  Abdominal:     General: Bowel sounds are normal. There is no distension.     Palpations: Abdomen is soft. There is no mass.     Tenderness: There is no abdominal tenderness.  Musculoskeletal:        General: No tenderness. Normal range of motion.     Cervical back: Normal range of motion and neck supple.     Right lower leg: No edema.     Left lower leg: No edema.     Comments: Upper / Lower Extremities: - Normal muscle tone, strength bilateral upper extremities 5/5, lower extremities 5/5  Lymphadenopathy:     Cervical: No cervical adenopathy.  Skin:    General: Skin is warm and dry.     Findings: No erythema or rash.  Neurological:     Mental Status: He is alert and oriented to person, place, and time.     Comments: Distal sensation intact to light touch all extremities  Psychiatric:        Mood and Affect: Mood normal.        Behavior: Behavior normal.        Thought Content: Thought content normal.     Comments: Well groomed, good eye contact, normal speech and thoughts    Results for orders placed or performed in visit on 06/22/22  COMPLETE METABOLIC PANEL WITH GFR  Result Value Ref Range   Glucose, Bld 97 65 - 99 mg/dL   BUN 17 7 - 25 mg/dL   Creat 1.09 0.70 - 1.28 mg/dL   eGFR 69 > OR = 60 mL/min/1.12m   BUN/Creatinine Ratio SEE NOTE: 6 - 22 (calc)   Sodium 141 135 - 146 mmol/L   Potassium 4.4 3.5 - 5.3 mmol/L   Chloride 110 98 - 110 mmol/L   CO2 23 20 - 32 mmol/L   Calcium 9.1 8.6 - 10.3 mg/dL   Total Protein 6.1 6.1 - 8.1 g/dL   Albumin 4.0 3.6 - 5.1 g/dL   Globulin 2.1 1.9 - 3.7 g/dL (calc)   AG Ratio 1.9 1.0 - 2.5 (calc)   Total Bilirubin 0.4 0.2 - 1.2 mg/dL   Alkaline phosphatase (APISO) 87 35 - 144 U/L   AST 22 10 - 35 U/L   ALT 16 9 - 46 U/L  CBC w/Diff/Platelet  Result Value  Ref Range   WBC 6.3 3.8 - 10.8 Thousand/uL   RBC 4.97 4.20 - 5.80 Million/uL   Hemoglobin 15.0 13.2 - 17.1 g/dL   HCT 44.5 38.5 - 50.0 %   MCV 89.5 80.0 - 100.0 fL   MCH 30.2 27.0 - 33.0 pg   MCHC 33.7 32.0 - 36.0 g/dL   RDW 12.9 11.0 - 15.0 %   Platelets 257 140 - 400 Thousand/uL   MPV 11.1 7.5 - 12.5 fL   Neutro Abs 4,297 1,500 - 7,800 cells/uL   Lymphs Abs 1,254 850 - 3,900 cells/uL   Absolute Monocytes 422 200 - 950 cells/uL   Eosinophils Absolute 309 15 - 500 cells/uL   Basophils Absolute 19 0 - 200 cells/uL   Neutrophils Relative % 68.2 %   Total Lymphocyte 19.9 %   Monocytes Relative 6.7 %   Eosinophils Relative 4.9 %   Basophils Relative 0.3 %      Assessment & Plan:   Problem List Items Addressed This Visit     Benign prostatic hyperplasia with incomplete bladder emptying   Benign prostatic hyperplasia with urinary hesitancy    Mostly controlled Continues on alpha blocker Doxazosin 68m, now higher dose and taking later in morning/day Also on Saw Palmetto now as well.      Centrilobular emphysema (HCC)    Stable COPD Emphysema On Breo, Albuterol AS NEEDED Seems he is taking breo more AS NEEDED instead of daily. Advised to take maintenance daily Re order both today      Relevant Medications   BREO ELLIPTA 100-25 MCG/ACT AEPB   PROAIR HFA 108 (90 Base) MCG/ACT inhaler   Low back pain   Pure hypercholesterolemia    Previously controlled On Pravastatin 165m daily      Other Visit Diagnoses     Annual physical exam    -  Primary   Abnormal glucose          Updated Health Maintenance information Fasting labs ordered today. Pending results Encouraged improvement to lifestyle with diet and exercise Goal of weight loss  Lumbar X-ray today, was previously ordered, he may go complete this order today Will follow up results with patient.  Plan to mail copy of labs and contact him with X-ray   Meds ordered this encounter  Medications   BREO ELLIPTA  100-25 MCG/ACT AEPB    Sig: Inhale 1 puff into the lungs daily.    Dispense:  60 each    Refill:  12   PROAIR HFA 108 (90 Base) MCG/ACT inhaler    Sig: Inhale 1-2 puffs into the lungs every 6 (six) hours as needed. For shortness of breath    Dispense:  8 g    Refill:  12      Follow up plan: Return in about 6 months (around 06/12/2023) for 6 month follow-up COPD, BPH, Back Pain.    [facility]ie PutnamDOM[facility]ensed[facility] 12/12/2022, 8:24 AM

## 2022-12-12 NOTE — Assessment & Plan Note (Signed)
Stable COPD Emphysema On Breo, Albuterol AS NEEDED Seems he is taking breo more AS NEEDED instead of daily. Advised to take maintenance daily Re order both today

## 2022-12-13 LAB — CBC WITH DIFFERENTIAL/PLATELET
Absolute Monocytes: 529 cells/uL (ref 200–950)
Basophils Absolute: 19 cells/uL (ref 0–200)
Basophils Relative: 0.3 %
Eosinophils Absolute: 378 cells/uL (ref 15–500)
Eosinophils Relative: 6 %
HCT: 48.2 % (ref 38.5–50.0)
Hemoglobin: 16.2 g/dL (ref 13.2–17.1)
Lymphs Abs: 1481 cells/uL (ref 850–3900)
MCH: 30.1 pg (ref 27.0–33.0)
MCHC: 33.6 g/dL (ref 32.0–36.0)
MCV: 89.4 fL (ref 80.0–100.0)
MPV: 11.6 fL (ref 7.5–12.5)
Monocytes Relative: 8.4 %
Neutro Abs: 3893 cells/uL (ref 1500–7800)
Neutrophils Relative %: 61.8 %
Platelets: 265 10*3/uL (ref 140–400)
RBC: 5.39 10*6/uL (ref 4.20–5.80)
RDW: 13.2 % (ref 11.0–15.0)
Total Lymphocyte: 23.5 %
WBC: 6.3 10*3/uL (ref 3.8–10.8)

## 2022-12-13 LAB — COMPLETE METABOLIC PANEL WITH GFR
AG Ratio: 2 (calc) (ref 1.0–2.5)
ALT: 16 U/L (ref 9–46)
AST: 22 U/L (ref 10–35)
Albumin: 4.3 g/dL (ref 3.6–5.1)
Alkaline phosphatase (APISO): 80 U/L (ref 35–144)
BUN/Creatinine Ratio: 18 (calc) (ref 6–22)
BUN: 24 mg/dL (ref 7–25)
CO2: 26 mmol/L (ref 20–32)
Calcium: 9.5 mg/dL (ref 8.6–10.3)
Chloride: 110 mmol/L (ref 98–110)
Creat: 1.3 mg/dL — ABNORMAL HIGH (ref 0.70–1.28)
Globulin: 2.2 g/dL (calc) (ref 1.9–3.7)
Glucose, Bld: 91 mg/dL (ref 65–99)
Potassium: 5 mmol/L (ref 3.5–5.3)
Sodium: 144 mmol/L (ref 135–146)
Total Bilirubin: 0.7 mg/dL (ref 0.2–1.2)
Total Protein: 6.5 g/dL (ref 6.1–8.1)
eGFR: 56 mL/min/{1.73_m2} — ABNORMAL LOW (ref >=60)

## 2022-12-13 LAB — LIPID PANEL
Cholesterol: 154 mg/dL (ref <200)
HDL: 40 mg/dL (ref >=40)
LDL Cholesterol (Calc): 93 mg/dL (calc)
Non-HDL Cholesterol (Calc): 114 mg/dL (calc) (ref <130)
Total CHOL/HDL Ratio: 3.9 (calc) (ref <5.0)
Triglycerides: 113 mg/dL (ref <150)

## 2022-12-13 LAB — PSA: PSA: 0.71 ng/mL (ref <=4.00)

## 2022-12-13 LAB — HEMOGLOBIN A1C
Hgb A1c MFr Bld: 5.7 % of total Hgb — ABNORMAL HIGH (ref <5.7)
Mean Plasma Glucose: 117 mg/dL
eAG (mmol/L): 6.5 mmol/L

## 2022-12-17 ENCOUNTER — Encounter: Payer: Self-pay | Admitting: Family Medicine

## 2022-12-17 MED ORDER — GABAPENTIN 300 MG PO CAPS: 300.0000 mg | ORAL_CAPSULE | Freq: Every day | ORAL | 3 refills | Status: DC

## 2022-12-17 NOTE — Telephone Encounter (Signed)
Ordered Gabapentin  Nobie Putnam, DO [facility] 12/17/2022, 6:13 PM

## 2022-12-21 ENCOUNTER — Encounter: Payer: Self-pay | Admitting: Family Medicine

## 2022-12-21 ENCOUNTER — Ambulatory Visit (INDEPENDENT_AMBULATORY_CARE_PROVIDER_SITE_OTHER): Payer: PPO | Admitting: Family Medicine

## 2022-12-21 VITALS — BP 144/76 | HR 90 | Ht 70.0 in | Wt 145.0 lb

## 2022-12-21 DIAGNOSIS — K219 Gastro-esophageal reflux disease without esophagitis: Secondary | ICD-10-CM | POA: Diagnosis not present

## 2022-12-21 DIAGNOSIS — K449 Diaphragmatic hernia without obstruction or gangrene: Secondary | ICD-10-CM | POA: Diagnosis not present

## 2022-12-21 DIAGNOSIS — R630 Anorexia: Secondary | ICD-10-CM | POA: Diagnosis not present

## 2022-12-21 DIAGNOSIS — R1319 Other dysphagia: Secondary | ICD-10-CM

## 2022-12-21 NOTE — Patient Instructions (Addendum)
Thank you for coming to the office today.  Referral to [facility] doctors to evaluate it further.  I believe it is related to the Hiatal Hernia. Or can be esophagus problem causing this swallowing difficulty.  They will call you. If you do not hear back in 2-3 weeks, please call them instead.  [facility] [place], [place] [address]  Office Phone: [phone]  --------------  Low Back X-ray showed Arthritis, that is severe.  Lumbar Spine X-ray shows multiple levels of degenerative joint / disc disease of lumbar spine. This is consistent with some advanced osteoarthritis of spine. We can discuss other medications in future if not successful on current treatment.     Chemistry      Component Value Date/Time   NA 144 12/12/2022 0843   K 5.0 12/12/2022 0843   CL 110 12/12/2022 0843   CO2 26 12/12/2022 0843   BUN 24 12/12/2022 0843   CREATININE 1.30 (H) 12/12/2022 0843      Component Value Date/Time   CALCIUM 9.5 12/12/2022 0843   ALKPHOS 68 10/24/2016 0947   AST 22 12/12/2022 0843   ALT 16 12/12/2022 0843   BILITOT 0.7 12/12/2022 0843     CBC:    Component Value Date/Time   WBC 6.3 12/12/2022 0843   HGB 16.2 12/12/2022 0843   HCT 48.2 12/12/2022 0843   PLT 265 12/12/2022 0843   MCV 89.4 12/12/2022 0843   NEUTROABS 3,893 12/12/2022 0843   LYMPHSABS 1,481 12/12/2022 0843   MONOABS 0.4 12/12/2018 1021   EOSABS 378 12/12/2022 0843   BASOSABS 19 12/12/2022 0843    Lipid Panel     Component Value Date/Time   CHOL 154 12/12/2022 0843   TRIG 113 12/12/2022 0843   HDL 40 12/12/2022 0843   CHOLHDL 3.9 12/12/2022 0843   LDLCALC 93 12/12/2022 0843   slightly elevated Creatinine, which shows some slight reduction of kidney function. This is not a new problem. We can monitor it.   Hemoglobin A1c mildly elevated at 5.7, this is early range Pre Diabetes. I am not concerned about this. It is mild and does not require any treatment. You  may lower some extra starches carbs sugars in diet to help.   Please schedule a Follow-up Appointment to: Return if symptoms worsen or fail to improve.  If you have any other questions or concerns, please feel free to call the office or send a message through [place]. You may also schedule an earlier appointment if necessary.  Additionally, you may be receiving a survey about your experience at our office within a few days to 1 week by e-mail or mail. We value your feedback.  Nobie Putnam, DO [place]

## 2022-12-21 NOTE — Progress Notes (Unsigned)
Subjective:    Patient ID: James [name], male    DOB: 1941-05-07, 82 y.o.   MRN: [id number]  James [name] [place] is a 82 y.o. male presenting on 12/21/2022 for Dysphagia  Patient presents for a same day appointment.  HPI  Hiatal Hernia / Dysphagia / Difficulty Swallowing  Chronic problem for many years. He admits has had several flare ups in the past. Previous doctors have not been able to solve it. Drinks warm beverage before eats. Admits some heartburn at times. Taking Omeprazole '40mg'$  daily, with some relief. Has not had relief on it before. He tries to avoid greasy fried foods. Admits some reduced appetite  Previously seen by Dr Vira Agar at [place] back in 2020. He has had esophagus stretched before.      06/22/2022   10:39 AM 12/29/2021    2:46 PM 11/29/2021   12:26 PM  Depression screen PHQ 2/9  Decreased Interest 0 0 0  Down, Depressed, Hopeless 0 0 0  PHQ - 2 Score 0 0 0  Altered sleeping 0    Tired, decreased energy 0    Change in appetite 0    Feeling bad or failure about yourself  0    Trouble concentrating 0    Moving slowly or fidgety/restless 0    Suicidal thoughts 0    PHQ-9 Score 0    Difficult doing work/chores Not difficult at all      Social History   Tobacco Use   Smoking status: Every Day    Packs/day: 0.25    Years: 50.00    Total pack years: 12.50    Types: Cigarettes   Smokeless tobacco: Never  Vaping Use   Vaping Use: Never used  Substance Use Topics   Alcohol use: Yes    Alcohol/week: 3.0 standard drinks of alcohol    Types: 3 Cans of beer per week   Drug use: No    Review of Systems Per HPI unless specifically indicated above     Objective:    BP (!) 144/76   Pulse 90   Ht '5\' 10"'$  (1.778 m)   Wt 145 lb (65.8 kg)   SpO2 100%   BMI 20.81 kg/m   Wt Readings from Last 3 Encounters:  12/21/22 145 lb (65.8 kg)  12/12/22 145 lb (65.8 kg)  11/09/22 143 lb 12.8 oz (65.2 kg)    Physical Exam Vitals and nursing note  reviewed.  Constitutional:      General: He is not in acute distress.    Appearance: Normal appearance. He is well-developed. He is not diaphoretic.     Comments: Well-appearing, comfortable, cooperative  HENT:     Head: Normocephalic and atraumatic.  Eyes:     General:        Right eye: No discharge.        Left eye: No discharge.     Conjunctiva/sclera: Conjunctivae normal.  Cardiovascular:     Rate and Rhythm: Normal rate.  Pulmonary:     Effort: Pulmonary effort is normal.  Skin:    General: Skin is warm and dry.     Findings: No erythema or rash.  Neurological:     Mental Status: He is alert and oriented to person, place, and time.  Psychiatric:        Mood and Affect: Mood normal.        Behavior: Behavior normal.        Thought Content: Thought content normal.  Comments: Well groomed, good eye contact, normal speech and thoughts      Results for orders placed or performed in visit on 12/12/22  PSA  Result Value Ref Range   PSA 0.71 < OR = 4.00 ng/mL  Hemoglobin A1c  Result Value Ref Range   Hgb A1c MFr Bld 5.7 (H) <5.7 % of total Hgb   Mean Plasma Glucose 117 mg/dL   eAG (mmol/L) 6.5 mmol/L  Lipid panel  Result Value Ref Range   Cholesterol 154 <200 mg/dL   HDL 40 > OR = 40 mg/dL   Triglycerides 113 <150 mg/dL   LDL Cholesterol (Calc) 93 mg/dL (calc)   Total CHOL/HDL Ratio 3.9 <5.0 (calc)   Non-HDL Cholesterol (Calc) 114 <130 mg/dL (calc)  CBC with Differential/Platelet  Result Value Ref Range   WBC 6.3 3.8 - 10.8 Thousand/uL   RBC 5.39 4.20 - 5.80 Million/uL   Hemoglobin 16.2 13.2 - 17.1 g/dL   HCT 48.2 38.5 - 50.0 %   MCV 89.4 80.0 - 100.0 fL   MCH 30.1 27.0 - 33.0 pg   MCHC 33.6 32.0 - 36.0 g/dL   RDW 13.2 11.0 - 15.0 %   Platelets 265 140 - 400 Thousand/uL   MPV 11.6 7.5 - 12.5 fL   Neutro Abs 3,893 1,500 - 7,800 cells/uL   Lymphs Abs 1,481 850 - 3,900 cells/uL   Absolute Monocytes 529 200 - 950 cells/uL   Eosinophils Absolute 378 15 - 500  cells/uL   Basophils Absolute 19 0 - 200 cells/uL   Neutrophils Relative % 61.8 %   Total Lymphocyte 23.5 %   Monocytes Relative 8.4 %   Eosinophils Relative 6.0 %   Basophils Relative 0.3 %  COMPLETE METABOLIC PANEL WITH GFR  Result Value Ref Range   Glucose, Bld 91 65 - 99 mg/dL   BUN 24 7 - 25 mg/dL   Creat 1.30 (H) 0.70 - 1.28 mg/dL   eGFR 56 (L) > OR = 60 mL/min/1.74m   BUN/Creatinine Ratio 18 6 - 22 (calc)   Sodium 144 135 - 146 mmol/L   Potassium 5.0 3.5 - 5.3 mmol/L   Chloride 110 98 - 110 mmol/L   CO2 26 20 - 32 mmol/L   Calcium 9.5 8.6 - 10.3 mg/dL   Total Protein 6.5 6.1 - 8.1 g/dL   Albumin 4.3 3.6 - 5.1 g/dL   Globulin 2.2 1.9 - 3.7 g/dL (calc)   AG Ratio 2.0 1.0 - 2.5 (calc)   Total Bilirubin 0.7 0.2 - 1.2 mg/dL   Alkaline phosphatase (APISO) 80 35 - 144 U/L   AST 22 10 - 35 U/L   ALT 16 9 - 46 U/L      Assessment & Plan:   Problem List Items Addressed This Visit     Dysphagia - Primary   Relevant Orders   Ambulatory referral to Gastroenterology   Gastroesophageal reflux disease without esophagitis   Relevant Orders   Ambulatory referral to Gastroenterology   Hiatal hernia   Relevant Orders   Ambulatory referral to Gastroenterology   Other Visit Diagnoses     Loss of appetite       Relevant Orders   Ambulatory referral to Gastroenterology       chronic esophageal dysphagia and hiatal hernia, he has had for years, in the past has had issues with food getting stuck and GERD. He has seen previous GI with Upper Endoscopy and stretching of esophagus, last 2020. Now having worsening symptoms and flares.  Referral to [facility] doctors to evaluate it further.  I believe it is related to the Hiatal Hernia. Or can be esophagus problem causing this swallowing difficulty.  --------------  Low Back X-ray showed Arthritis, that is severe.   No orders of the defined types were placed in this encounter.   Orders Placed This Encounter   Procedures   Ambulatory referral to Gastroenterology    Referral Priority:   Routine    Referral Type:   Consultation    Referral Reason:   Specialty Services Required    Number of Visits Requested:   1     Follow up plan: Return if symptoms worsen or fail to improve.    Nobie Putnam, [facility] 12/21/2022, 10:16 AM

## 2022-12-22 ENCOUNTER — Encounter: Payer: Self-pay | Admitting: Family Medicine

## 2023-01-04 ENCOUNTER — Ambulatory Visit (INDEPENDENT_AMBULATORY_CARE_PROVIDER_SITE_OTHER): Payer: PPO

## 2023-01-04 VITALS — Wt 145.0 lb

## 2023-01-04 DIAGNOSIS — Z122 Encounter for screening for malignant neoplasm of respiratory organs: Secondary | ICD-10-CM | POA: Diagnosis not present

## 2023-01-04 DIAGNOSIS — Z0000 Encounter for general adult medical examination without abnormal findings: Secondary | ICD-10-CM

## 2023-01-04 NOTE — Patient Instructions (Signed)
Mr. James [name] , Thank you for taking time to come for your Medicare Wellness Visit. I appreciate your ongoing commitment to your health goals. Please review the following plan we discussed and let me know if I can assist you in the future.   These are the goals we discussed:  Goals      DIET - EAT MORE FRUITS AND VEGETABLES     DIET - INCREASE WATER INTAKE        This is a list of the screening recommended for you and due dates:  Health Maintenance  Topic Date Due   COVID-19 Vaccine (1) Never done   Hepatitis C Screening: USPSTF Recommendation to screen - Ages 80-79 yo.  Never done   Zoster (Shingles) Vaccine (1 of 2) Never done   Medicare Annual Wellness Visit  01/04/2024   DTaP/Tdap/Td vaccine (2 - Td or Tdap) 12/11/2027   Pneumonia Vaccine  Completed   Flu Shot  Completed   HPV Vaccine  Aged Out    Advanced directives: no  Conditions/risks identified: none  Next appointment: Follow up in one year for your annual wellness visit. 01/10/24 @ 1:00 pm by phone  Preventive Care 65 Years and Older, Male  Preventive care refers to lifestyle choices and visits with your health care provider that can promote health and wellness. What does preventive care include? A yearly physical exam. This is also called an annual well check. Dental exams once or twice a year. Routine eye exams. Ask your health care provider how often you should have your eyes checked. Personal lifestyle choices, including: Daily care of your teeth and gums. Regular physical activity. Eating a healthy diet. Avoiding tobacco and drug use. Limiting alcohol use. Practicing safe sex. Taking low doses of aspirin every day. Taking vitamin and mineral supplements as recommended by your health care provider. What happens during an annual well check? The services and screenings done by your health care provider during your annual well check will depend on your age, overall health, lifestyle risk factors, and family  history of disease. Counseling  Your health care provider may ask you questions about your: Alcohol use. Tobacco use. Drug use. Emotional well-being. Home and relationship well-being. Sexual activity. Eating habits. History of falls. Memory and ability to understand (cognition). Work and work Statistician. Screening  You may have the following tests or measurements: Height, weight, and BMI. Blood pressure. Lipid and cholesterol levels. These may be checked every 5 years, or more frequently if you are over 32 years old. Skin check. Lung cancer screening. You may have this screening every year starting at age 80 if you have a 30-pack-year history of smoking and currently smoke or have quit within the past 15 years. Fecal occult blood test (FOBT) of the stool. You may have this test every year starting at age 57. Flexible sigmoidoscopy or colonoscopy. You may have a sigmoidoscopy every 5 years or a colonoscopy every 10 years starting at age 47. Prostate cancer screening. Recommendations will vary depending on your family history and other risks. Hepatitis C blood test. Hepatitis B blood test. Sexually transmitted disease (STD) testing. Diabetes screening. This is done by checking your blood sugar (glucose) after you have not eaten for a while (fasting). You may have this done every 1-3 years. Abdominal aortic aneurysm (AAA) screening. You may need this if you are a current or former smoker. Osteoporosis. You may be screened starting at age 47 if you are at high risk. Talk with your health care  provider about your test results, treatment options, and if necessary, the need for more tests. Vaccines  Your health care provider may recommend certain vaccines, such as: Influenza vaccine. This is recommended every year. Tetanus, diphtheria, and acellular pertussis (Tdap, Td) vaccine. You may need a Td booster every 10 years. Zoster vaccine. You may need this after age 80. Pneumococcal  13-valent conjugate (PCV13) vaccine. One dose is recommended after age 80. Pneumococcal polysaccharide (PPSV23) vaccine. One dose is recommended after age 80. Talk to your health care provider about which screenings and vaccines you need and how often you need them. This information is not intended to replace advice given to you by your health care provider. Make sure you discuss any questions you have with your health care provider. Document Released: 11/11/2015 Document Revised: 07/04/2016 Document Reviewed: 08/16/2015 Elsevier Interactive Patient Education  2017 [place] Prevention in the Home Falls can cause injuries. They can happen to people of all ages. There are many things you can do to make your home safe and to help prevent falls. What can I do on the outside of my home? Regularly fix the edges of walkways and driveways and fix any cracks. Remove anything that might make you trip as you walk through a door, such as a raised step or threshold. Trim any bushes or trees on the path to your home. Use bright outdoor lighting. Clear any walking paths of anything that might make someone trip, such as rocks or tools. Regularly check to see if handrails are loose or broken. Make sure that both sides of any steps have handrails. Any raised decks and porches should have guardrails on the edges. Have any leaves, snow, or ice cleared regularly. Use sand or salt on walking paths during winter. Clean up any spills in your garage right away. This includes oil or grease spills. What can I do in the bathroom? Use night lights. Install grab bars by the toilet and in the tub and shower. Do not use towel bars as grab bars. Use non-skid mats or decals in the tub or shower. If you need to sit down in the shower, use a plastic, non-slip stool. Keep the floor dry. Clean up any water that spills on the floor as soon as it happens. Remove soap buildup in the tub or shower regularly. Attach  bath mats securely with double-sided non-slip rug tape. Do not have throw rugs and other things on the floor that can make you trip. What can I do in the bedroom? Use night lights. Make sure that you have a light by your bed that is easy to reach. Do not use any sheets or blankets that are too big for your bed. They should not hang down onto the floor. Have a firm chair that has side arms. You can use this for support while you get dressed. Do not have throw rugs and other things on the floor that can make you trip. What can I do in the kitchen? Clean up any spills right away. Avoid walking on wet floors. Keep items that you use a lot in easy-to-reach places. If you need to reach something above you, use a strong step stool that has a grab bar. Keep electrical cords out of the way. Do not use floor polish or wax that makes floors slippery. If you must use wax, use non-skid floor wax. Do not have throw rugs and other things on the floor that can make you trip. What can I  do with my stairs? Do not leave any items on the stairs. Make sure that there are handrails on both sides of the stairs and use them. Fix handrails that are broken or loose. Make sure that handrails are as long as the stairways. Check any carpeting to make sure that it is firmly attached to the stairs. Fix any carpet that is loose or worn. Avoid having throw rugs at the top or bottom of the stairs. If you do have throw rugs, attach them to the floor with carpet tape. Make sure that you have a light switch at the top of the stairs and the bottom of the stairs. If you do not have them, ask someone to add them for you. What else can I do to help prevent falls? Wear shoes that: Do not have high heels. Have rubber bottoms. Are comfortable and fit you well. Are closed at the toe. Do not wear sandals. If you use a stepladder: Make sure that it is fully opened. Do not climb a closed stepladder. Make sure that both sides of the  stepladder are locked into place. Ask someone to hold it for you, if possible. Clearly mark and make sure that you can see: Any grab bars or handrails. First and last steps. Where the edge of each step is. Use tools that help you move around (mobility aids) if they are needed. These include: Canes. Walkers. Scooters. Crutches. Turn on the lights when you go into a dark area. Replace any light bulbs as soon as they burn out. Set up your furniture so you have a clear path. Avoid moving your furniture around. If any of your floors are uneven, fix them. If there are any pets around you, be aware of where they are. Review your medicines with your doctor. Some medicines can make you feel dizzy. This can increase your chance of falling. Ask your doctor what other things that you can do to help prevent falls. This information is not intended to replace advice given to you by your health care provider. Make sure you discuss any questions you have with your health care provider. Document Released: 08/11/2009 Document Revised: 03/22/2016 Document Reviewed: 11/19/2014 Elsevier Interactive Patient Education  2017 [employer].

## 2023-01-04 NOTE — Progress Notes (Signed)
I connected with  Wrangler Kleven Chabot on 01/04/23 by a audio enabled telemedicine application and verified that I am speaking with the correct person using two identifiers.  Patient Location: Home  Provider Location: Office/Clinic  I discussed the limitations of evaluation and management by telemedicine. The patient expressed understanding and agreed to proceed.  Subjective:   Teshawn Soucie Sylve is a 82 y.o. male who presents for Medicare Annual/Subsequent preventive examination.  Review of Systems     Cardiac Risk Factors include: advanced age (>51mn, >>56women);male gender     Objective:    There were no vitals filed for this visit. There is no height or weight on file to calculate BMI.     01/04/2023    2:55 PM 12/29/2021    2:49 PM 12/17/2018    9:44 AM 12/12/2018    9:41 AM 12/05/2017    2:23 PM 01/04/2017    9:02 AM  Advanced Directives  Does Patient Have a Medical Advance Directive? No Yes Yes No No No  Type of AArmed forces technical officerof ACatharineLiving will     Does patient want to make changes to medical advance directive?  Yes (Inpatient - patient defers changing a medical advance directive and declines information at this time)    No - Patient declined  Copy of HHitchcockin Chart?  No - copy requested      Would patient like information on creating a medical advance directive? No - Patient declined   No - Patient declined No - Patient declined No - Patient declined    Current Medications (verified) Outpatient Encounter Medications as of 01/04/2023  Medication Sig   albuterol (VENTOLIN HFA) 108 (90 Base) MCG/ACT inhaler Inhale 1-2 puffs into the lungs every 4 (four) hours as needed for wheezing or shortness of breath.   BREO ELLIPTA 100-25 MCG/ACT AEPB Inhale 1 puff into the lungs daily.   diclofenac Sodium (VOLTAREN) 1 % GEL diclofenac 1 % topical gel  APPLY TO AFFECTED AREA 4 TIMES A DAY   fluticasone (FLONASE)  50 MCG/ACT nasal spray Place 2 sprays into both nostrils daily. Use for 4-6 weeks then stop and use seasonally or as needed.   meclizine (ANTIVERT) 25 MG tablet Take 1 tablet (25 mg total) by mouth 3 (three) times daily as needed for dizziness.   omeprazole (PRILOSEC) 40 MG capsule TAKE 1 CAPSULE (40 MG TOTAL) BY MOUTH DAILY.   doxazosin (CARDURA) 4 MG tablet Take 1 tablet (4 mg total) by mouth daily. (Patient not taking: Reported on 01/04/2023)   gabapentin (NEURONTIN) 300 MG capsule Take 1 capsule (300 mg total) by mouth at bedtime.   meloxicam (MOBIC) 7.5 MG tablet  (Patient not taking: Reported on 01/04/2023)   memantine (NAMENDA) 5 MG tablet  (Patient not taking: Reported on 01/04/2023)   minocycline (DYNACIN) 100 MG tablet  (Patient not taking: Reported on 01/04/2023)   Multiple Vitamin (MULTIVITAMIN WITH MINERALS) TABS tablet Take 1 tablet by mouth daily. Centrum. (Patient not taking: Reported on 01/04/2023)   naproxen sodium (ANAPROX) 220 MG tablet Take 220-440 mg by mouth 2 (two) times daily as needed (for pain.). (Patient not taking: Reported on 01/04/2023)   pravastatin (PRAVACHOL) 10 MG tablet Take 1 tablet (10 mg total) by mouth at bedtime. (Patient not taking: Reported on 01/04/2023)   tadalafil (CIALIS) 20 MG tablet Take 1 tablet (20 mg total) by mouth every other day as needed for erectile dysfunction.  No facility-administered encounter medications on file as of 01/04/2023.    Allergies (verified) Aspirin   History: Past Medical History:  Diagnosis Date   Arthritis    Asthma    Baker's cyst of knee, right    BPH (benign prostatic hyperplasia)    Bronchitis    Carotid stenosis, left    Carpal tunnel syndrome, right    COPD (chronic obstructive pulmonary disease) (HCC)    Dysphagia    Elevated cholesterol    Erectile dysfunction    Gastritis and duodenitis    GERD (gastroesophageal reflux disease)    Neuropathy of right forearm    Schatzki's ring    Tendinitis    Vertigo,  intermittent    Weight loss    Past Surgical History:  Procedure Laterality Date   BALLOON DILATION N/A 12/17/2018   Procedure: BALLOON DILATION;  Surgeon: Manya Silvas, MD;  Location: [facility] ENDOSCOPY;  Service: Endoscopy;  Laterality: N/A;   COLONOSCOPY WITH ESOPHAGOGASTRODUODENOSCOPY (EGD) AND ESOPHAGEAL DILATION (ED)     ESOPHAGOGASTRODUODENOSCOPY (EGD) WITH PROPOFOL N/A 10/26/2016   Procedure: ESOPHAGOGASTRODUODENOSCOPY (EGD) WITH PROPOFOL;  Surgeon: Manya Silvas, MD;  Location[facility] ENDOSCOPY;  Service: Endoscopy;  Laterality: N/A;   ESOPHAGOGASTRODUODENOSCOPY (EGD) WITH PROPOFOL N/A 12/17/2018   Procedure: ESOPHAGOGASTRODUODENOSCOPY (EGD) WITH PROPOFOL;  Surgeon: Manya Silvas, MD;  Location: [facility] ENDOSCOPY;  Service: Endoscopy;  Laterality: N/A;   HERNIA REPAIR     SAVORY DILATION N/A 10/26/2016   Procedure: SAVORY DILATION;  Surgeon: Manya Silvas, MD;  Location: [facility] ENDOSCOPY;  Service: Endoscopy;  Laterality: N/A;   History reviewed. No pertinent family history. Social History   Socioeconomic History   Marital status: Married    Spouse name: Not on file   Number of children: Not on file   Years of education: Not on file   Highest education level: Not on file  Occupational History   Not on file  Tobacco Use   Smoking status: Every Day    Packs/day: 0.25    Years: 50.00    Total pack years: 12.50    Types: Cigarettes   Smokeless tobacco: Never  Vaping Use   Vaping Use: Never used  Substance and Sexual Activity   Alcohol use: Yes    Alcohol/week: 3.0 standard drinks of alcohol    Types: 3 Cans of beer per week   Drug use: No   Sexual activity: Not on file  Other Topics Concern   Not on file  Social History Narrative   Not on file   Social Determinants of Health   Financial Resource Strain: Low Risk  (01/04/2023)   Overall Financial Resource Strain (CARDIA)    Difficulty of Paying Living Expenses: Not hard at all  Food Insecurity: No Food  Insecurity (01/04/2023)   Hunger Vital Sign    Worried About Running Out of Food in the Last Year: Never true    Ran Out of Food in the Last Year: Never true  Transportation Needs: No Transportation Needs (01/04/2023)   PRAPARE - Hydrologist (Medical): No    Lack of Transportation (Non-Medical): No  Physical Activity: Insufficiently Active (01/04/2023)   Exercise Vital Sign    Days of Exercise per Week: 7 days    Minutes of Exercise per Session: 20 min  Stress: No Stress Concern Present (01/04/2023)   [place]    Feeling of Stress : Not at all  Social Connections: Moderately Isolated (  01/04/2023)   Social Connection and Isolation Panel [NHANES]    Frequency of Communication with Friends and Family: More than three times a week    Frequency of Social Gatherings with Friends and Family: More than three times a week    Attends Religious Services: 1 to 4 times per year    Active Member of [employer] or Organizations: No    Attends Archivist Meetings: Never    Marital Status: Widowed    Tobacco Counseling Ready to quit: Not Answered Counseling given: Not Answered   Clinical Intake:  Pre-visit preparation completed: Yes  Pain : No/denies pain     Nutritional Risks: None Diabetes: No  How often do you need to have someone help you when you read instructions, pamphlets, or other written materials from your doctor or pharmacy?: 1 - Never  Diabetic?no  Interpreter Needed?: No  Information entered by :: Kirke Shaggy, LPN   Activities of Daily Living    01/04/2023    2:55 PM  In your present state of health, do you have any difficulty performing the following activities:  Hearing? 1  Vision? 0  Difficulty concentrating or making decisions? 0  Walking or climbing stairs? 0  Dressing or bathing? 0  Doing errands, shopping? 0  Preparing Food and eating ? N  Using the Toilet? N   In the past six months, have you accidently leaked urine? N  Do you have problems with loss of bowel control? N  Managing your Medications? N  Managing your Finances? N  Housekeeping or managing your Housekeeping? N    Patient Care Team: Olin Hauser, DO as PCP - General (Family Medicine)  Indicate any recent Medical Services you may have received from other than [facility] providers in the past year (date may be approximate).     Assessment:   This is a routine wellness examination for Gwyndolyn Saxon.  Hearing/Vision screen Hearing Screening - Comments:: Has aids, doesn't wear them Vision Screening - Comments:: No glasses  Dietary issues and exercise activities discussed: Current Exercise Habits: Home exercise routine, Type of exercise: walking;stretching, Time (Minutes): 15, Frequency (Times/Week): 7, Weekly Exercise (Minutes/Week): 105, Intensity: Mild   Goals Addressed             This Visit's Progress    DIET - INCREASE WATER INTAKE         Depression Screen    01/04/2023    2:53 PM 06/22/2022   10:39 AM 12/29/2021    2:46 PM 11/29/2021   12:26 PM 08/08/2021    8:46 AM  PHQ 2/9 Scores  PHQ - 2 Score 0 0 0 0 0  PHQ- 9 Score 0 0       Fall Risk    01/04/2023    2:55 PM 06/22/2022   10:39 AM 12/29/2021    2:51 PM  [place] in the past year? 0 0 0  Number falls in past yr: 0 0 0  Injury with Fall? 0 0 0  Risk for fall due to : No Fall Risks No Fall Risks No Fall Risks  Follow up Falls prevention discussed;Falls evaluation completed Falls evaluation completed Falls evaluation completed    FALL RISK PREVENTION PERTAINING TO THE HOME:  Any stairs in or around the home? No  If so, are there any without handrails? No  Home free of loose throw rugs in walkways, pet beds, electrical cords, etc? Yes  Adequate lighting in your home to reduce risk of  falls? Yes   ASSISTIVE DEVICES UTILIZED TO PREVENT FALLS:  Life alert? No  Use of a cane, walker or w/c? No   Grab bars in the bathroom? Yes  Shower chair or bench in shower? No  Elevated toilet seat or a handicapped toilet? No   Cognitive Function:        01/04/2023    3:01 PM  6CIT Screen  What Year? 0 points  What month? 0 points  What time? 0 points  Count back from 20 0 points  Months in reverse 4 points  Repeat phrase 4 points  Total Score 8 points    Immunizations Immunization History  Administered Date(s) Administered   Fluad Quad(high Dose 65+) 08/08/2021, 07/05/2022   Influenza Split 07/28/2014   Influenza-Unspecified 09/25/2017, 08/20/2019   PNEUMOCOCCAL CONJUGATE-20 10/10/2021   Pneumococcal Polysaccharide-23 11/24/2013   Tdap 12/10/2017    TDAP status: Up to date  Flu Vaccine status: Up to date  Pneumococcal vaccine status: Up to date  Covid-19 vaccine status: Declined, Education has been provided regarding the importance of this vaccine but patient still declined. Advised may receive this vaccine at local pharmacy or Health Dept.or vaccine clinic. Aware to provide a copy of the vaccination record if obtained from local pharmacy or Health Dept. Verbalized acceptance and understanding.  Qualifies for Shingles Vaccine? Yes   Zostavax completed No   Shingrix Completed?: No.    Education has been provided regarding the importance of this vaccine. Patient has been advised to call insurance company to determine out of pocket expense if they have not yet received this vaccine. Advised may also receive vaccine at local pharmacy or Health Dept. Verbalized acceptance and understanding.  Screening Tests Health Maintenance  Topic Date Due   COVID-19 Vaccine (1) Never done   Hepatitis C Screening  Never done   Zoster Vaccines- Shingrix (1 of 2) Never done   Medicare Annual Wellness (AWV)  01/04/2024   DTaP/Tdap/Td (2 - Td or Tdap) 12/11/2027   Pneumonia Vaccine 65+ Years old  Completed   INFLUENZA VACCINE  Completed   HPV VACCINES  Aged Out    Health  Maintenance  Health Maintenance Due  Topic Date Due   COVID-19 Vaccine (1) Never done   Hepatitis C Screening  Never done   Zoster Vaccines- Shingrix (1 of 2) Never done    Colorectal cancer screening: No longer required.   Lung Cancer Screening: (Low Dose CT Chest recommended if Age 27-80 years, 30 pack-year currently smoking OR have quit w/in 15years.) does qualify.   Lung Cancer Screening Referral: referral sent 01/04/23  Additional Screening:  Hepatitis C Screening: does not qualify; Completed no  Vision Screening: Recommended annual ophthalmology exams for early detection of glaucoma and other disorders of the eye. Is the patient up to date with their annual eye exam?  No  Who is the provider or what is the name of the office in which the patient attends annual eye exams? No one If pt is not established with a provider, would they like to be referred to a provider to establish care? No .   Dental Screening: Recommended annual dental exams for proper oral hygiene  Community Resource Referral / Chronic Care Management: CRR required this visit?  No   CCM required this visit?  No      Plan:     I have personally reviewed and noted the following in the patient's chart:   Medical and social history Use of alcohol, tobacco or illicit  drugs  Current medications and supplements including opioid prescriptions. Patient is not currently taking opioid prescriptions. Functional ability and status Nutritional status Physical activity Advanced directives List of other physicians Hospitalizations, surgeries, and ER visits in previous 12 months Vitals Screenings to include cognitive, depression, and falls Referrals and appointments  In addition, I have reviewed and discussed with patient certain preventive protocols, quality metrics, and best practice recommendations. A written personalized care plan for preventive services as well as general preventive health recommendations were  provided to patient.     Dionisio David, LPN   [id number]   Nurse Notes: lung ca screen ordered

## 2023-01-22 DIAGNOSIS — D2272 Melanocytic nevi of left lower limb, including hip: Secondary | ICD-10-CM | POA: Diagnosis not present

## 2023-01-22 DIAGNOSIS — D2262 Melanocytic nevi of left upper limb, including shoulder: Secondary | ICD-10-CM | POA: Diagnosis not present

## 2023-01-22 DIAGNOSIS — D2261 Melanocytic nevi of right upper limb, including shoulder: Secondary | ICD-10-CM | POA: Diagnosis not present

## 2023-01-22 DIAGNOSIS — X32XXXA Exposure to sunlight, initial encounter: Secondary | ICD-10-CM | POA: Diagnosis not present

## 2023-01-22 DIAGNOSIS — L570 Actinic keratosis: Secondary | ICD-10-CM | POA: Diagnosis not present

## 2023-01-22 DIAGNOSIS — Z85828 Personal history of other malignant neoplasm of skin: Secondary | ICD-10-CM | POA: Diagnosis not present

## 2023-01-22 DIAGNOSIS — L02221 Furuncle of abdominal wall: Secondary | ICD-10-CM | POA: Diagnosis not present

## 2023-02-08 ENCOUNTER — Telehealth: Payer: Self-pay | Admitting: *Deleted

## 2023-02-08 NOTE — Telephone Encounter (Signed)
FYI:  Per CMS age guidelines (42 - 88), pt does not qualify for lung cancer screening. His referral has been cancelled.

## 2023-03-20 DIAGNOSIS — K2990 Gastroduodenitis, unspecified, without bleeding: Secondary | ICD-10-CM | POA: Diagnosis not present

## 2023-03-20 DIAGNOSIS — R1319 Other dysphagia: Secondary | ICD-10-CM | POA: Diagnosis not present

## 2023-03-20 DIAGNOSIS — Z8719 Personal history of other diseases of the digestive system: Secondary | ICD-10-CM | POA: Diagnosis not present

## 2023-03-20 DIAGNOSIS — R630 Anorexia: Secondary | ICD-10-CM | POA: Diagnosis not present

## 2023-03-20 DIAGNOSIS — K222 Esophageal obstruction: Secondary | ICD-10-CM | POA: Diagnosis not present

## 2023-06-12 ENCOUNTER — Other Ambulatory Visit: Payer: Self-pay | Admitting: Family Medicine

## 2023-06-12 DIAGNOSIS — N401 Enlarged prostate with lower urinary tract symptoms: Secondary | ICD-10-CM

## 2023-06-12 DIAGNOSIS — K219 Gastro-esophageal reflux disease without esophagitis: Secondary | ICD-10-CM

## 2023-06-14 NOTE — Telephone Encounter (Signed)
Requested Prescriptions  Pending Prescriptions Disp Refills   omeprazole (PRILOSEC) 40 MG capsule [Pharmacy Med Name: OMEPRAZOLE DR 40 MG CAPSULE] 90 capsule 0    Sig: TAKE 1 CAPSULE (40 MG TOTAL) BY MOUTH DAILY.     Gastroenterology: Proton Pump Inhibitors Passed - 06/12/2023  5:10 PM      Passed - Valid encounter within last 12 months    Recent Outpatient Visits           5 months ago Esophageal dysphagia   [place] [facility] [place], Netta Neat, DO   6 months ago Annual physical exam   [place] [facility] [place], Netta Neat, DO   7 months ago Benign prostatic hyperplasia with incomplete bladder emptying   [place] [facility] Smitty Cords, [place]   11 months ago Acute nasopharyngitis   [place] [facility] [employer], Salvadore [place], [place]   11 months ago Dizziness   [place] [facility] M[place]um, Erin E, PA-C               doxazosin (CARDURA) 2 MG tablet [Pharmacy Med Name: DOXAZOSIN MESYLATE 2 MG TAB] 180 tablet 1    Sig: TAKE 2 TABLETS BY MOUTH DAILY.     Cardiovascular:  Alpha Blockers Failed - 06/12/2023  5:10 PM      Failed - Last BP in normal range    BP Readings from Last 1 Encounters:  12/21/22 (!) 144/76         Passed - Valid encounter within last 6 months    Recent Outpatient Visits           5 months ago Esophageal dysphagia   [place] [facility] [place], Netta Neat, DO   6 months ago Annual physical exam   [place] [facility] Smitty Cords, DO   7 months ago Benign prostatic hyperplasia with incomplete bladder emptying   [place] [facility] Smitty Cords, [place]   11 months ago Acute nasopharyngitis   [place] [facility] [place], Salvadore [place], [place]   11 months ago Dizziness   [place] [facility] Mecum, Oswaldo [place], [place]

## 2023-06-28 ENCOUNTER — Encounter: Payer: Self-pay | Admitting: *Deleted

## 2023-06-28 ENCOUNTER — Encounter
Admission: RE | Disposition: A | Discharge status: Home / Self Care | Payer: Self-pay | Source: Ambulatory Visit | Attending: Gastroenterology

## 2023-06-28 ENCOUNTER — Ambulatory Visit
Admission: RE | Admit: 2023-06-28 | Discharge: 2023-06-28 | Disposition: A | Discharge status: Home / Self Care | Payer: PPO | Source: Ambulatory Visit | Attending: Gastroenterology | Admitting: Gastroenterology

## 2023-06-28 ENCOUNTER — Ambulatory Visit: Payer: PPO

## 2023-06-28 DIAGNOSIS — F17200 Nicotine dependence, unspecified, uncomplicated: Secondary | ICD-10-CM | POA: Diagnosis not present

## 2023-06-28 DIAGNOSIS — F0280 Dementia in other diseases classified elsewhere without behavioral disturbance: Secondary | ICD-10-CM | POA: Diagnosis not present

## 2023-06-28 DIAGNOSIS — K219 Gastro-esophageal reflux disease without esophagitis: Secondary | ICD-10-CM | POA: Insufficient documentation

## 2023-06-28 DIAGNOSIS — R1310 Dysphagia, unspecified: Secondary | ICD-10-CM | POA: Diagnosis not present

## 2023-06-28 DIAGNOSIS — K222 Esophageal obstruction: Secondary | ICD-10-CM | POA: Insufficient documentation

## 2023-06-28 DIAGNOSIS — G309 Alzheimer's disease, unspecified: Secondary | ICD-10-CM | POA: Insufficient documentation

## 2023-06-28 DIAGNOSIS — J4489 Other specified chronic obstructive pulmonary disease: Secondary | ICD-10-CM | POA: Insufficient documentation

## 2023-06-28 DIAGNOSIS — Z7951 Long term (current) use of inhaled steroids: Secondary | ICD-10-CM | POA: Insufficient documentation

## 2023-06-28 DIAGNOSIS — E7800 Pure hypercholesterolemia, unspecified: Secondary | ICD-10-CM | POA: Diagnosis not present

## 2023-06-28 HISTORY — PX: ESOPHAGOGASTRODUODENOSCOPY (EGD) WITH PROPOFOL: SHX5813

## 2023-06-28 SURGERY — ESOPHAGOGASTRODUODENOSCOPY (EGD) WITH PROPOFOL
Anesthesia: General

## 2023-06-28 MED ORDER — SODIUM CHLORIDE 0.9 % IV SOLN: INTRAVENOUS | Status: DC

## 2023-06-28 MED ORDER — PROPOFOL 10 MG/ML IV BOLUS
INTRAVENOUS | Status: AC
  Filled 2023-06-28: qty 40

## 2023-06-28 MED ORDER — LIDOCAINE HCL (CARDIAC) PF 100 MG/5ML IV SOSY
PREFILLED_SYRINGE | INTRAVENOUS | Status: DC | PRN
  Administered 2023-06-28: 100 mg via INTRAVENOUS

## 2023-06-28 MED ORDER — PROPOFOL 10 MG/ML IV BOLUS
INTRAVENOUS | Status: DC | PRN
  Administered 2023-06-28 (×2): 50 mg via INTRAVENOUS
  Administered 2023-06-28: 20 mg via INTRAVENOUS

## 2023-06-28 NOTE — Transfer of Care (Signed)
Immediate Anesthesia Transfer of Care Note  Patient: James [name]  Procedure(s) Performed: ESOPHAGOGASTRODUODENOSCOPY (EGD) WITH PROPOFOL  Patient Location: PACU  Anesthesia Type:MAC  Level of Consciousness: drowsy  Airway & Oxygen Therapy: Patient Spontanous Breathing  Post-op Assessment: Report given to RN and Post -op Vital signs reviewed and stable  Post vital signs: Reviewed and stable  Last Vitals:  Vitals Value Taken Time  BP 126/79 06/28/23 1313  Temp 36.8 C 06/28/23 1313  Pulse 63 06/28/23 1315  Resp 17 06/28/23 1315  SpO2 99 % 06/28/23 1315  Vitals shown include unfiled device data.  Last Pain:  Vitals:   06/28/23 1313  TempSrc: Temporal  PainSc: Asleep         Complications: No notable events documented.

## 2023-06-28 NOTE — Anesthesia Procedure Notes (Signed)
Procedure Name: MAC Date/Time: 06/28/2023 12:55 PM  Performed by: Elisabeth Pigeon, CRNAOxygen Delivery Method: Nasal cannula

## 2023-06-28 NOTE — H&P (Signed)
Outpatient short stay form Pre-procedure 06/28/2023  Regis Bill, MD  Primary Physician: Smitty Cords, DO  Reason for visit: Dysphagia  History of present illness:    82 y/o gentleman with history of GERD and COPD here for EGD for dysphagia. No blood thinners. No family history of GI malignancies. No neck surgeries.    Current Facility-Administered Medications:    0.9 %  sodium chloride infusion, , Intravenous, Continuous, Charish Schroepfer, Rossie Muskrat, MD  Medications Prior to Admission  Medication Sig Dispense Refill Last Dose   BREO ELLIPTA 100-25 MCG/ACT AEPB Inhale 1 puff into the lungs daily. 60 each 12 06/27/2023   diclofenac Sodium (VOLTAREN) 1 % GEL diclofenac 1 % topical gel  APPLY TO AFFECTED AREA 4 TIMES A DAY   Past Week   omeprazole (PRILOSEC) 40 MG capsule TAKE 1 CAPSULE (40 MG TOTAL) BY MOUTH DAILY. 90 capsule 0 06/27/2023   albuterol (VENTOLIN HFA) 108 (90 Base) MCG/ACT inhaler Inhale 1-2 puffs into the lungs every 4 (four) hours as needed for wheezing or shortness of breath. 8 g 2  at prn   doxazosin (CARDURA) 4 MG tablet Take 1 tablet (4 mg total) by mouth daily. (Patient not taking: Reported on 01/04/2023) 90 tablet 3    fluticasone (FLONASE) 50 MCG/ACT nasal spray Place 2 sprays into both nostrils daily. Use for 4-6 weeks then stop and use seasonally or as needed. 16 g 3  at prn   gabapentin (NEURONTIN) 300 MG capsule Take 1 capsule (300 mg total) by mouth at bedtime. (Patient not taking: Reported on 06/28/2023) 90 capsule 3 Not Taking   meclizine (ANTIVERT) 25 MG tablet Take 1 tablet (25 mg total) by mouth 3 (three) times daily as needed for dizziness. 30 tablet 0  at prn   meloxicam (MOBIC) 7.5 MG tablet  (Patient not taking: Reported on 01/04/2023)      memantine (NAMENDA) 5 MG tablet  (Patient not taking: Reported on 01/04/2023)      minocycline (DYNACIN) 100 MG tablet  (Patient not taking: Reported on 01/04/2023)      Multiple Vitamin (MULTIVITAMIN WITH MINERALS)  TABS tablet Take 1 tablet by mouth daily. Centrum. (Patient not taking: Reported on 01/04/2023)      naproxen sodium (ANAPROX) 220 MG tablet Take 220-440 mg by mouth 2 (two) times daily as needed (for pain.). (Patient not taking: Reported on 01/04/2023)      pravastatin (PRAVACHOL) 10 MG tablet Take 1 tablet (10 mg total) by mouth at bedtime. (Patient not taking: Reported on 01/04/2023) 90 tablet 3    tadalafil (CIALIS) 20 MG tablet Take 1 tablet (20 mg total) by mouth every other day as needed for erectile dysfunction. 45 tablet 11      Allergies  Allergen Reactions   Aspirin Hives, Swelling and Other (See Comments)    Blisters     Past Medical History:  Diagnosis Date   Arthritis    Asthma    Baker's cyst of knee, right    BPH (benign prostatic hyperplasia)    Bronchitis    Carotid stenosis, left    Carpal tunnel syndrome, right    COPD (chronic obstructive pulmonary disease) (HCC)    Dysphagia    Elevated cholesterol    Erectile dysfunction    Gastritis and duodenitis    GERD (gastroesophageal reflux disease)    Neuropathy of right forearm    Schatzki's ring    Tendinitis    Vertigo, intermittent    Weight loss  Review of systems:  Otherwise negative.    Physical Exam  Gen: Alert, oriented. Appears stated age.  HEENT: PERRLA. Lungs: No respiratory distress CV: RRR Abd: soft, benign, no masses Ext: No edema    Planned procedures: Proceed with EGD. The patient understands the nature of the planned procedure, indications, risks, alternatives and potential complications including but not limited to bleeding, infection, perforation, damage to internal organs and possible oversedation/side effects from anesthesia. The patient agrees and gives consent to proceed.  Please refer to procedure notes for findings, recommendations and patient disposition/instructions.     Regis Bill, MD [facility] Gastroenterology

## 2023-06-28 NOTE — Op Note (Signed)
[facility] Gastroenterology Patient Name: James [name] Procedure Date: 06/28/2023 12:51 PM MRN: [id number] Account #: [id number] Date of Birth: February 14, 1941 Admit Type: Outpatient Age: 82 Room: [facility] ENDO ROOM 3 Gender: Male Note Status: Finalized Instrument Name: Upper Endoscope [id number] Procedure:             Upper GI endoscopy Indications:           Dysphagia Providers:             Eather Colas MD, MD Referring MD:          Smitty Cords (Referring MD) Medicines:             Monitored Anesthesia Care Complications:         No immediate complications. Estimated blood loss:                         Minimal. Procedure:             Pre-Anesthesia Assessment:                        - Prior to the procedure, a History and Physical was                         performed, and patient medications and allergies were                         reviewed. The patient is competent. The risks and                         benefits of the procedure and the sedation options and                         risks were discussed with the patient. All questions                         were answered and informed consent was obtained.                         Patient identification and proposed procedure were                         verified by the physician, the nurse, the                         anesthesiologist, the anesthetist and the technician                         in the endoscopy suite. Mental Status Examination:                         alert and oriented. Airway Examination: normal                         oropharyngeal airway and neck mobility. Respiratory                         Examination: clear to auscultation. CV Examination:  normal. Prophylactic Antibiotics: The patient does not                         require prophylactic antibiotics. Prior                         Anticoagulants: The patient has taken no anticoagulant                          or antiplatelet agents. ASA Grade Assessment: III - A                         patient with severe systemic disease. After reviewing                         the risks and benefits, the patient was deemed in                         satisfactory condition to undergo the procedure. The                         anesthesia plan was to use monitored anesthesia care                         (MAC). Immediately prior to administration of                         medications, the patient was re-assessed for adequacy                         to receive sedatives. The heart rate, respiratory                         rate, oxygen saturations, blood pressure, adequacy of                         pulmonary ventilation, and response to care were                         monitored throughout the procedure. The physical                         status of the patient was re-assessed after the                         procedure.                        After obtaining informed consent, the endoscope was                         passed under direct vision. Throughout the procedure,                         the patient's blood pressure, pulse, and oxygen                         saturations were monitored continuously. The  Endosonoscope was introduced through the mouth, and                         advanced to the second part of duodenum. The upper GI                         endoscopy was accomplished without difficulty. The                         patient tolerated the procedure well. Findings:      A non-obstructing Schatzki ring was found in the lower third of the       esophagus. A TTS dilator was passed through the scope. Dilation with a       15-16.5-18 mm balloon dilator was performed to 16.5 mm. The dilation       site was examined and showed moderate mucosal disruption. It appeared       the source of the mucosal disruption was actually more proximal than the       actual schatzki ring.  Estimated blood loss was minimal.      The entire examined stomach was normal.      The examined duodenum was normal. Impression:            - Non-obstructing Schatzki ring. Dilated.                        - Normal stomach.                        - Normal examined duodenum.                        - No specimens collected. Recommendation:        - Discharge patient to home.                        - Resume previous diet.                        - Continue present medications.                        - Repeat upper endoscopy in 2-3 months for retreatment.                        - Return to referring physician as previously                         scheduled. Procedure Code(s):     --- Professional ---                        [phone], Esophagogastroduodenoscopy, flexible,                         transoral; with transendoscopic balloon dilation of                         esophagus (less than 30 mm diameter) Diagnosis Code(s):     --- Professional ---  K22.2, Esophageal obstruction                        R13.10, Dysphagia, unspecified CPT copyright 2022 American Medical Association. All rights reserved. The codes documented in this report are preliminary and upon coder review may  be revised to meet current compliance requirements. Eather Colas MD, MD 06/28/2023 1:19:55 PM Number of Addenda: 0 Note Initiated On: 06/28/2023 12:51 PM Estimated Blood Loss:  Estimated blood loss was minimal.      [facility]

## 2023-06-28 NOTE — H&P (Signed)
Outpatient short stay form Pre-procedure 06/28/2023  Regis Bill, MD  Primary Physician: Smitty Cords, DO  Reason for visit:  Dysphagia  History of present illness:    82 y/o gentleman with history of COPD and recurrent esophageal strictures here for EGD for solid food dysphagia. No blood thinners. No family history of GI malignancies. He smokes 1/2 ppd. No neck surgeries.   No current facility-administered medications for this encounter.  Medications Prior to Admission  Medication Sig Dispense Refill Last Dose   BREO ELLIPTA 100-25 MCG/ACT AEPB Inhale 1 puff into the lungs daily. 60 each 12 06/27/2023   diclofenac Sodium (VOLTAREN) 1 % GEL diclofenac 1 % topical gel  APPLY TO AFFECTED AREA 4 TIMES A DAY   Past Week   omeprazole (PRILOSEC) 40 MG capsule TAKE 1 CAPSULE (40 MG TOTAL) BY MOUTH DAILY. 90 capsule 0 06/27/2023   albuterol (VENTOLIN HFA) 108 (90 Base) MCG/ACT inhaler Inhale 1-2 puffs into the lungs every 4 (four) hours as needed for wheezing or shortness of breath. 8 g 2  at prn   doxazosin (CARDURA) 4 MG tablet Take 1 tablet (4 mg total) by mouth daily. (Patient not taking: Reported on 01/04/2023) 90 tablet 3    fluticasone (FLONASE) 50 MCG/ACT nasal spray Place 2 sprays into both nostrils daily. Use for 4-6 weeks then stop and use seasonally or as needed. 16 g 3  at prn   gabapentin (NEURONTIN) 300 MG capsule Take 1 capsule (300 mg total) by mouth at bedtime. (Patient not taking: Reported on 06/28/2023) 90 capsule 3 Not Taking   meclizine (ANTIVERT) 25 MG tablet Take 1 tablet (25 mg total) by mouth 3 (three) times daily as needed for dizziness. 30 tablet 0  at prn   meloxicam (MOBIC) 7.5 MG tablet  (Patient not taking: Reported on 01/04/2023)      memantine (NAMENDA) 5 MG tablet  (Patient not taking: Reported on 01/04/2023)      minocycline (DYNACIN) 100 MG tablet  (Patient not taking: Reported on 01/04/2023)      Multiple Vitamin (MULTIVITAMIN WITH MINERALS) TABS  tablet Take 1 tablet by mouth daily. Centrum. (Patient not taking: Reported on 01/04/2023)      naproxen sodium (ANAPROX) 220 MG tablet Take 220-440 mg by mouth 2 (two) times daily as needed (for pain.). (Patient not taking: Reported on 01/04/2023)      pravastatin (PRAVACHOL) 10 MG tablet Take 1 tablet (10 mg total) by mouth at bedtime. (Patient not taking: Reported on 01/04/2023) 90 tablet 3    tadalafil (CIALIS) 20 MG tablet Take 1 tablet (20 mg total) by mouth every other day as needed for erectile dysfunction. 45 tablet 11      Allergies  Allergen Reactions   Aspirin Hives, Swelling and Other (See Comments)    Blisters     Past Medical History:  Diagnosis Date   Arthritis    Asthma    Baker's cyst of knee, right    BPH (benign prostatic hyperplasia)    Bronchitis    Carotid stenosis, left    Carpal tunnel syndrome, right    COPD (chronic obstructive pulmonary disease) (HCC)    Dysphagia    Elevated cholesterol    Erectile dysfunction    Gastritis and duodenitis    GERD (gastroesophageal reflux disease)    Neuropathy of right forearm    Schatzki's ring    Tendinitis    Vertigo, intermittent    Weight loss     Review of  systems:  Otherwise negative.    Physical Exam  Gen: Alert, oriented. Appears stated age.  HEENT: PERRLA. Lungs: No respiratory distress CV: RRR Abd: soft, benign, no masses Ext: No edema    Planned procedures: Proceed with EGD. The patient understands the nature of the planned procedure, indications, risks, alternatives and potential complications including but not limited to bleeding, infection, perforation, damage to internal organs and possible oversedation/side effects from anesthesia. The patient agrees and gives consent to proceed.  Please refer to procedure notes for findings, recommendations and patient disposition/instructions.     Regis Bill, MD [facility] Gastroenterology

## 2023-06-28 NOTE — Anesthesia Preprocedure Evaluation (Signed)
Anesthesia Evaluation  Patient identified by MRN, date of birth, ID band Patient awake    Reviewed: Allergy & Precautions, H&P , NPO status , reviewed documented beta blocker date and time   History of Anesthesia Complications Negative for: history of anesthetic complications  Airway Mallampati: II  TM Distance: >3 FB Neck ROM: limited    Dental  (+) Partial Upper, Chipped, Missing, Dental Advidsory Given   Pulmonary neg shortness of breath, asthma , neg sleep apnea, COPD, neg recent URI, Current Smoker and Patient abstained from smoking.    + decreased breath sounds      Cardiovascular negative cardio ROS  Rhythm:regular     Neuro/Psych neg Seizures PSYCHIATRIC DISORDERS Anxiety    Dementia  Neuromuscular disease    GI/Hepatic Neg liver ROS, hiatal hernia,GERD  ,,  Endo/Other  negative endocrine ROS    Renal/GU negative Renal ROS     Musculoskeletal  (+) Arthritis ,    Abdominal   Peds  Hematology negative hematology ROS (+)   Anesthesia Other Findings Past Medical History: No date: Arthritis No date: Asthma No date: Baker's cyst of knee, right No date: BPH (benign prostatic hyperplasia) No date: Bronchitis No date: Carotid stenosis, left No date: Carpal tunnel syndrome, right No date: COPD (chronic obstructive pulmonary disease) (HCC) No date: Dysphagia No date: Elevated cholesterol No date: Erectile dysfunction No date: Gastritis and duodenitis No date: GERD (gastroesophageal reflux disease) No date: Neuropathy of right forearm No date: Schatzki's ring No date: SDAT (senile dementia of Alzheimer's type) (HCC) No date: SDAT (senile dementia of Alzheimer's type) (HCC) No date: SDAT (senile dementia of Alzheimer's type) (HCC) No date: Tendinitis No date: Vertigo, intermittent No date: Weight loss  Past Surgical History: No date: COLONOSCOPY WITH ESOPHAGOGASTRODUODENOSCOPY (EGD) AND  ESOPHAGEAL  DILATION (ED) 10/26/2016: ESOPHAGOGASTRODUODENOSCOPY (EGD) WITH PROPOFOL; N/A     Comment:  Procedure: ESOPHAGOGASTRODUODENOSCOPY (EGD) WITH               PROPOFOL;  Surgeon: Scot Jun, MD;  Location[facility]              ENDOSCOPY;  Service: Endoscopy;  Laterality: N/A; No date: HERNIA REPAIR 10/26/2016: SAVORY DILATION; N/A     Comment:  Procedure: SAVORY DILATION;  Surgeon: Scot Jun,               MD;  Location: [facility] ENDOSCOPY;  Service: Endoscopy;                Laterality: N/A;  BMI    Body Mass Index:  21.62 kg/m      Reproductive/Obstetrics                             Anesthesia Physical Anesthesia Plan  ASA: 3  Anesthesia Plan: General   Post-op Pain Management:    Induction: Intravenous  PONV Risk Score and Plan: 1 and Treatment may vary due to age or medical condition, TIVA and Propofol infusion  Airway Management Planned: Nasal Cannula and Natural Airway  Additional Equipment:   Intra-op Plan:   Post-operative Plan:   Informed Consent: I have reviewed the patients History and Physical, chart, labs and discussed the procedure including the risks, benefits and alternatives for the proposed anesthesia with the patient or authorized representative who has indicated his/her understanding and acceptance.     Dental Advisory Given  Plan Discussed with:   Anesthesia Plan Comments:  Anesthesia Quick Evaluation

## 2023-06-28 NOTE — Interval H&P Note (Signed)
History and Physical Interval Note:  06/28/2023 12:17 PM  James [name]  has presented today for surgery, with the diagnosis of Dysphagia.  The various methods of treatment have been discussed with the patient and family. After consideration of risks, benefits and other options for treatment, the patient has consented to  Procedure(s): ESOPHAGOGASTRODUODENOSCOPY (EGD) WITH PROPOFOL (N/A) as a surgical intervention.  The patient's history has been reviewed, patient examined, no change in status, stable for surgery.  I have reviewed the patient's chart and labs.  Questions were answered to the patient's satisfaction.     Regis Bill  Ok to proceed with EGD

## 2023-07-02 ENCOUNTER — Encounter: Payer: Self-pay | Admitting: Gastroenterology

## 2023-07-03 ENCOUNTER — Other Ambulatory Visit: Payer: Self-pay | Admitting: Gastroenterology

## 2023-07-03 DIAGNOSIS — R1319 Other dysphagia: Secondary | ICD-10-CM

## 2023-07-03 DIAGNOSIS — K222 Esophageal obstruction: Secondary | ICD-10-CM

## 2023-07-03 DIAGNOSIS — K224 Dyskinesia of esophagus: Secondary | ICD-10-CM

## 2023-07-04 NOTE — Anesthesia Postprocedure Evaluation (Signed)
Anesthesia Post Note  Patient: James [name]  Procedure(s) Performed: ESOPHAGOGASTRODUODENOSCOPY (EGD) WITH PROPOFOL Balloon dilation wire-guided  Patient location during evaluation: Endoscopy Anesthesia Type: General Level of consciousness: awake and alert Pain management: pain level controlled Vital Signs Assessment: post-procedure vital signs reviewed and stable Respiratory status: spontaneous breathing, nonlabored ventilation, respiratory function stable and patient connected to nasal cannula oxygen Cardiovascular status: blood pressure returned to baseline and stable Postop Assessment: no apparent nausea or vomiting Anesthetic complications: no   No notable events documented.   Last Vitals:  Vitals:   06/28/23 1313 06/28/23 1333  BP: 126/79 (!) 122/94  Pulse: (!) 59   Resp: 20   Temp: 36.8 C   SpO2: 100%     Last Pain:  Vitals:   06/28/23 1333  TempSrc:   PainSc: 0-No pain                 Lenard Simmer

## 2023-07-11 ENCOUNTER — Ambulatory Visit
Admission: RE | Admit: 2023-07-11 | Discharge: 2023-07-11 | Disposition: A | Discharge status: Home / Self Care | Payer: PPO | Source: Ambulatory Visit | Attending: Gastroenterology | Admitting: Gastroenterology

## 2023-07-11 DIAGNOSIS — R1319 Other dysphagia: Secondary | ICD-10-CM | POA: Diagnosis not present

## 2023-07-11 DIAGNOSIS — K224 Dyskinesia of esophagus: Secondary | ICD-10-CM | POA: Insufficient documentation

## 2023-07-11 DIAGNOSIS — K222 Esophageal obstruction: Secondary | ICD-10-CM | POA: Insufficient documentation

## 2023-07-15 ENCOUNTER — Ambulatory Visit: Payer: Self-pay | Admitting: *Deleted

## 2023-07-15 NOTE — Telephone Encounter (Signed)
Summary: pt is complaining about the top of his head hurting 2-3 days and stomach pains going on for months.   Pt daughter stated pt is complaining about the top of his head hurting 2-3 days and stomach pains going on for months. Pt is not with his daughter. Please call him directly at [phone]. If you cannot reach him, please call Pam.   Please make sure both things are talked about, as pt may not mention when he comes in.  No appointments available.  Seeking clinical advice.         Reason for Disposition  [1] New headache AND [2] age > 59  Answer Assessment - Initial Assessment Questions 1. LOCATION: "Where does it hurt?"      Top of head- center 2. ONSET: "When did the headache start?" (Minutes, hours or days)      2-3 weeks 3. PATTERN: "Does the pain come and go, or has it been constant since it started?"     Increased pressure when changing position- bending over, can get dizzy, comes and goes 4. SEVERITY: "How bad is the pain?" and "What does it keep you from doing?"  (e.g., Scale 1-10; mild, moderate, or severe)   - MILD (1-3): doesn't interfere with normal activities    - MODERATE (4-7): interferes with normal activities or awakens from sleep    - SEVERE (8-10): excruciating pain, unable to do any normal activities        mild 5. RECURRENT SYMPTOM: "Have you ever had headaches before?" If Yes, ask: "When was the last time?" and "What happened that time?"      no 6. CAUSE: "What do you think is causing the headache?"     unsure 7. MIGRAINE: "Have you been diagnosed with migraine headaches?" If Yes, ask: "Is this headache similar?"      no 8. HEAD INJURY: "Has there been any recent injury to the head?"      no 9. OTHER SYMPTOMS: "Do you have any other symptoms?" (fever, stiff neck, eye pain, sore throat, cold symptoms)     Stomach problems  Protocols used: Headache-A-AH

## 2023-07-15 NOTE — Telephone Encounter (Signed)
Okay to see patient next week - we can use acute or HFU apt for this if there is not an alternative. He should be given return precautions if severe worsening symptoms, should be seen sooner at Urgent Care or ED.

## 2023-07-15 NOTE — Telephone Encounter (Signed)
  Chief Complaint: new onset headache Symptoms: increased pressure- top of head- hurts when bends over, slight dizziness at times, comes and goes Frequency: occurring 2-3 weeks- not going away Pertinent Negatives: Patient denies eye pain/vision changes Disposition: [] ED /[] Urgent Care (no appt availability in office) / [] Appointment(In office/virtual)/ []  [place] [facility]/ [] Home Care/ [] Refused Recommended Disposition /[] [place] [facility]/ [x]  Follow-up with PCP Additional Notes: Patient states he would like to be seen- no open appointment within disposition- patient states he can come in next week - but needs PCP approval- outside disposition.

## 2023-07-16 NOTE — Telephone Encounter (Signed)
I called and spoke to patient. We have scheduled him to see Dr. Althea Charon on 07/22/23 at 11:20 am per Dr. Kirtland Bouchard instructions. Patient is aware if his pain increases in severity, he should seek evaluation at urgent care or ED. He states at this time, it is not worsening, yet not improving. He understands and will seek evaluation if it worsens.

## 2023-07-22 ENCOUNTER — Ambulatory Visit
Admission: RE | Admit: 2023-07-22 | Discharge: 2023-07-22 | Disposition: A | Discharge status: Home / Self Care | Payer: PPO | Source: Ambulatory Visit | Attending: Family Medicine | Admitting: Family Medicine

## 2023-07-22 ENCOUNTER — Ambulatory Visit
Admission: RE | Admit: 2023-07-22 | Discharge: 2023-07-22 | Disposition: A | Discharge status: Home / Self Care | Payer: PPO | Attending: Family Medicine | Admitting: Family Medicine

## 2023-07-22 ENCOUNTER — Other Ambulatory Visit: Payer: Self-pay | Admitting: Family Medicine

## 2023-07-22 ENCOUNTER — Ambulatory Visit (INDEPENDENT_AMBULATORY_CARE_PROVIDER_SITE_OTHER): Payer: PPO | Admitting: Family Medicine

## 2023-07-22 ENCOUNTER — Encounter: Payer: Self-pay | Admitting: Family Medicine

## 2023-07-22 VITALS — BP 120/78 | HR 80 | Ht 71.0 in | Wt 143.0 lb

## 2023-07-22 DIAGNOSIS — K5900 Constipation, unspecified: Secondary | ICD-10-CM

## 2023-07-22 DIAGNOSIS — J432 Centrilobular emphysema: Secondary | ICD-10-CM

## 2023-07-22 DIAGNOSIS — R1030 Lower abdominal pain, unspecified: Secondary | ICD-10-CM | POA: Diagnosis not present

## 2023-07-22 DIAGNOSIS — G44209 Tension-type headache, unspecified, not intractable: Secondary | ICD-10-CM

## 2023-07-22 DIAGNOSIS — N401 Enlarged prostate with lower urinary tract symptoms: Secondary | ICD-10-CM

## 2023-07-22 DIAGNOSIS — Z23 Encounter for immunization: Secondary | ICD-10-CM

## 2023-07-22 DIAGNOSIS — R7309 Other abnormal glucose: Secondary | ICD-10-CM

## 2023-07-22 DIAGNOSIS — E7800 Pure hypercholesterolemia, unspecified: Secondary | ICD-10-CM

## 2023-07-22 DIAGNOSIS — K5730 Diverticulosis of large intestine without perforation or abscess without bleeding: Secondary | ICD-10-CM | POA: Diagnosis not present

## 2023-07-22 MED ORDER — BACLOFEN 10 MG PO TABS
5.0000 mg | ORAL_TABLET | Freq: Every evening | ORAL | 0 refills | Status: AC | PRN
Start: 2023-07-22 — End: ?

## 2023-07-22 NOTE — Patient Instructions (Addendum)
Thank you for coming to the office today.  X-ray at our office to check the bowels and constipation.   For Constipation (less frequent bowel movement that can be hard dry or involve straining).  Likely cause of your pain  Increase dose up to 2 capfuls then can go even higher to 3 or 4, or space them out some in morning and some in evening.  Recommend trying OTC Miralax 17g = 1 capful in large glass water once daily for now, try several days to see if working, goal is soft stool or BM 1-2 times daily, if too loose then reduce dose or try every other day. If not effective may need to increase it to 2 doses at once in AM or may do 1 in morning and 1 in afternoon/evening  - This medicine is very safe and can be used often without any problem and will not make you dehydrated. It is good for use on AS NEEDED BASIS or even MAINTENANCE therapy for longer term for several days to weeks at a time to help regulate bowel movements  Other more natural remedies or preventative treatment: - Increase hydration with water - Increase fiber in diet (high fiber foods = vegetables, leafy greens, oats/grains) - May take OTC Fiber supplement (metamucil powder or pill/gummy) - May try OTC Probiotic    Please schedule a Follow-up Appointment to: Return if symptoms worsen or fail to improve.  If you have any other questions or concerns, please feel free to call the office or send a message through MyChart. You may also schedule an earlier appointment if necessary.  Additionally, you may be receiving a survey about your experience at our office within a few days to 1 week by e-mail or mail. We value your feedback.  Saralyn Pilar, DO [facility], [place]

## 2023-07-22 NOTE — Progress Notes (Signed)
Subjective:    Patient ID: James [name], male    DOB: 30-Aug-1941, 82 y.o.   MRN: [id number]  James [name] is a 82 y.o. male presenting on 07/22/2023 for Headache (3 weeks, bend over and cough makes it worst) and Constipation (Years )   HPI  Discussed the use of AI scribe software for clinical note transcription with the patient, who gave verbal consent to proceed.  History of Present Illness   He presents with lower abdominal pain and constipation that has been ongoing for approximately three months. The pain is intermittent and its severity varies. The patient reports difficulty with bowel movements, often going four to five days without a bowel movement. Despite attempts to manage the constipation with dietary changes, such as increased fiber intake, the patient has not seen significant improvement. The patient has previously tried Miralax, taking one capful daily, but discontinued due to perceived lack of efficacy and messiness.  In addition to the abdominal pain and constipation, the patient has been experiencing headaches for the past two to three weeks. The headaches are described as being all over the head, but not severe. The headaches typically start later in the day and are somewhat relieved by Tylenol. The patient denies any associated sinus or pressure symptoms. 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  The patient's brother had a history of brain cancer, which raises some concern for the patient, but there are no specific symptoms suggestive of a similar condition at this time. The patient denies any new medications or changes in existing medications.      Additionally He eats higher fiber diet Last BM yesterday  History of BPH   History of hernia repair   Past Surgical History:  Procedure Laterality Date   BALLOON DILATION N/A 12/17/2018   Procedure: BALLOON DILATION;  Surgeon: Scot Jun,  MD;  Location: [facility] ENDOSCOPY;  Service: Endoscopy;  Laterality: N/A;   COLONOSCOPY WITH ESOPHAGOGASTRODUODENOSCOPY (EGD) AND ESOPHAGEAL DILATION (ED)     ESOPHAGOGASTRODUODENOSCOPY (EGD) WITH PROPOFOL N/A 10/26/2016   Procedure: ESOPHAGOGASTRODUODENOSCOPY (EGD) WITH PROPOFOL;  Surgeon: Scot Jun, MD;  Location: [facility] ENDOSCOPY;  Service: Endoscopy;  Laterality: N/A;   ESOPHAGOGASTRODUODENOSCOPY (EGD) WITH PROPOFOL N/A 12/17/2018   Procedure: ESOPHAGOGASTRODUODENOSCOPY (EGD) WITH PROPOFOL;  Surgeon: Scot Jun, MD;  Location: [facility] ENDOSCOPY;  Service: Endoscopy;  Laterality: N/A;   ESOPHAGOGASTRODUODENOSCOPY (EGD) WITH PROPOFOL N/A 06/28/2023   Procedure: ESOPHAGOGASTRODUODENOSCOPY (EGD) WITH PROPOFOL;  Surgeon: Regis Bill, MD;  Location: [facility] ENDOSCOPY;  Service: Endoscopy;  Laterality: N/A;   HERNIA REPAIR     SAVORY DILATION N/A 10/26/2016   Procedure: SAVORY DILATION;  Surgeon: Scot Jun, MD;  Location: [facility] ENDOSCOPY;  Service: Endoscopy;  Laterality: N/A;        07/22/2023   11:55 AM 01/04/2023    2:53 PM 06/22/2022   10:39 AM  Depression screen PHQ 2/9  Decreased Interest 0 0 0  Down, Depressed, Hopeless 0 0 0  PHQ - 2 Score 0 0 0  Altered sleeping 0 0 0  Tired, decreased energy 0 0 0  Change in appetite 0 0 0  Feeling bad or failure about yourself  0 0 0  Trouble concentrating 0 0 0  Moving slowly or fidgety/restless 0 0 0  Suicidal thoughts 0 0 0  PHQ-9 Score 0 0 0  Difficult doing work/chores Not difficult at all Not difficult at all Not difficult at  all    Social History   Tobacco Use   Smoking status: Every Day    Current packs/day: 0.25    Average packs/day: 0.3 packs/day for 50.0 years (12.5 ttl pk-yrs)    Types: Cigarettes   Smokeless tobacco: Never  Vaping Use   Vaping status: Never Used  Substance Use Topics   Alcohol use: Yes    Alcohol/week: 3.0 standard drinks of alcohol    Types: 3 Cans of beer per week   Drug use: No     Review of Systems Per HPI unless specifically indicated above     Objective:    BP 120/78   Pulse 80   Ht 5\' 11"  (1.803 m)   Wt 143 lb (64.9 kg)   BMI 19.94 kg/m   Wt Readings from Last 3 Encounters:  07/22/23 143 lb (64.9 kg)  06/28/23 142 lb 10.9 oz (64.7 kg)  01/04/23 145 lb (65.8 kg)    Physical Exam Vitals and nursing note reviewed.  Constitutional:      General: He is not in acute distress.    Appearance: Normal appearance. He is well-developed. He is not diaphoretic.     Comments: Well-appearing, comfortable, cooperative  HENT:     Head: Normocephalic and atraumatic.     Comments: Head and scalp appear normal without evidence of injury or ecchymosis or explanation of symptoms Eyes:     General:        Right eye: No discharge.        Left eye: No discharge.     Conjunctiva/sclera: Conjunctivae normal.  Neck:     Thyroid: No thyromegaly.  Cardiovascular:     Rate and Rhythm: Normal rate and regular rhythm.     Pulses: Normal pulses.     Heart sounds: Normal heart sounds. No murmur heard. Pulmonary:     Effort: Pulmonary effort is normal. No respiratory distress.     Breath sounds: Normal breath sounds. No wheezing or rales.  Abdominal:     General: Bowel sounds are normal. There is no distension.  Musculoskeletal:        General: Normal range of motion.     Cervical back: Normal range of motion and neck supple.  Lymphadenopathy:     Cervical: No cervical adenopathy.  Skin:    General: Skin is warm and dry.     Findings: No erythema or rash.  Neurological:     Mental Status: He is alert and oriented to person, place, and time. Mental status is at baseline.  Psychiatric:        Mood and Affect: Mood normal.        Behavior: Behavior normal.        Thought Content: Thought content normal.     Comments: Well groomed, good eye contact, normal speech and thoughts     I have personally reviewed the radiology report from 07/22/23 on KUB.  CLINICAL DATA:   Constipation, lower abdominal pain   EXAM: ABDOMEN - 1 VIEW   COMPARISON:  12/12/2022   FINDINGS: Lung bases clear. Normal bowel gas pattern. Residual contrast descending and sigmoid diverticula. Mild lumbar levoscoliosis with multilevel spondylitic change. Patchy iliofemoral arterial calcifications.   IMPRESSION: Negative.     Electronically Signed   By: Corlis Leak M.D.   On: 07/22/2023 13:30 Results for orders placed or performed in visit on 12/12/22  PSA  Result Value Ref Range   PSA 0.71 < OR = 4.00 ng/mL  Hemoglobin A1c  Result  Value Ref Range   Hgb A1c MFr Bld 5.7 (H) <5.7 % of total Hgb   Mean Plasma Glucose 117 mg/dL   eAG (mmol/L) 6.5 mmol/L  Lipid panel  Result Value Ref Range   Cholesterol 154 <200 mg/dL   HDL 40 > OR = 40 mg/dL   Triglycerides 604 <540 mg/dL   LDL Cholesterol (Calc) 93 mg/dL (calc)   Total CHOL/HDL Ratio 3.9 <5.0 (calc)   Non-HDL Cholesterol (Calc) 114 <130 mg/dL (calc)  CBC with Differential/Platelet  Result Value Ref Range   WBC 6.3 3.8 - 10.8 Thousand/uL   RBC 5.39 4.20 - 5.80 Million/uL   Hemoglobin 16.2 13.2 - 17.1 g/dL   HCT 98.1 19.1 - 47.8 %   MCV 89.4 80.0 - 100.0 fL   MCH 30.1 27.0 - 33.0 pg   MCHC 33.6 32.0 - 36.0 g/dL   RDW 29.5 62.1 - 30.8 %   Platelets 265 140 - 400 Thousand/uL   MPV 11.6 7.5 - 12.5 fL   Neutro Abs 3,893 1,500 - 7,800 cells/uL   Lymphs Abs 1,481 850 - 3,900 cells/uL   Absolute Monocytes 529 200 - 950 cells/uL   Eosinophils Absolute 378 15 - 500 cells/uL   Basophils Absolute 19 0 - 200 cells/uL   Neutrophils Relative % 61.8 %   Total Lymphocyte 23.5 %   Monocytes Relative 8.4 %   Eosinophils Relative 6.0 %   Basophils Relative 0.3 %  COMPLETE METABOLIC PANEL WITH GFR  Result Value Ref Range   Glucose, Bld 91 65 - 99 mg/dL   BUN 24 7 - 25 mg/dL   Creat 6.57 (H) 8.46 - 1.28 mg/dL   eGFR 56 (L) > OR = 60 mL/min/1.49m2   BUN/Creatinine Ratio 18 6 - 22 (calc)   Sodium 144 135 - 146 mmol/L    Potassium 5.0 3.5 - 5.3 mmol/L   Chloride 110 98 - 110 mmol/L   CO2 26 20 - 32 mmol/L   Calcium 9.5 8.6 - 10.3 mg/dL   Total Protein 6.5 6.1 - 8.1 g/dL   Albumin 4.3 3.6 - 5.1 g/dL   Globulin 2.2 1.9 - 3.7 g/dL (calc)   AG Ratio 2.0 1.0 - 2.5 (calc)   Total Bilirubin 0.7 0.2 - 1.2 mg/dL   Alkaline phosphatase (APISO) 80 35 - 144 U/L   AST 22 10 - 35 U/L   ALT 16 9 - 46 U/L      Assessment & Plan:   Problem List Items Addressed This Visit   None Visit Diagnoses     Constipation, unspecified constipation type    -  Primary   Relevant Orders   DG Abd 1 View   Need for influenza vaccination       Lower abdominal pain       Relevant Orders   DG Abd 1 View   Tension headache       Relevant Medications   baclofen (LIORESAL) 10 MG tablet       Assessment and Plan    Lower abdominal pain and constipation Pain in the lower abdomen for the past three months, associated with constipation. Patient has a history of prostate surgery. Pain is likely due to constipation, not prostate-related. -Order abdominal x-ray to assess for constipation. - KUB x-ray result today shows negative constipation or abdominal explanation, it shows some scoliosis seen. Patient was notified of X-ray results by phone.  -Proceed to Increase Miralax dosage to 2-4 capfuls daily, as needed for constipation episodes -Continue  high fiber diet.  Headache New onset, diffuse, non-severe headaches for the past 2-3 weeks. No associated sinus pressure Blood pressure is controlled.  Given frontal tension type headache symptoms worse in evening question if spine and back neck issues contributing. He has failed Gabapentin previously for other symptoms  -Prescribe muscle relaxant (half to one pill in the evening) for potential tension headache. -Continue Tylenol as needed.  General Health Maintenance -Return for blood work on Thursday at 8:45 am.      Orders Placed This Encounter  Procedures   DG Abd 1 View     Standing Status:   Future    Number of Occurrences:   1    Standing Expiration Date:   10/21/2023    Order Specific Question:   Reason for Exam (SYMPTOM  OR DIAGNOSIS REQUIRED)    Answer:   constipation    Order Specific Question:   Preferred imaging location?    Answer:   [facility]     Meds ordered this encounter  Medications   baclofen (LIORESAL) 10 MG tablet    Sig: Take 0.5-1 tablets (5-10 mg total) by mouth at bedtime as needed for muscle spasms (headaches).    Dispense:  30 each    Refill:  0      Follow up plan: Return if symptoms worsen or fail to improve.    Saralyn Pilar, DO [facility] 07/22/2023, 12:00 PM

## 2023-07-25 ENCOUNTER — Other Ambulatory Visit: Payer: PPO

## 2023-07-25 DIAGNOSIS — R7309 Other abnormal glucose: Secondary | ICD-10-CM | POA: Diagnosis not present

## 2023-07-25 DIAGNOSIS — E7800 Pure hypercholesterolemia, unspecified: Secondary | ICD-10-CM | POA: Diagnosis not present

## 2023-07-25 DIAGNOSIS — J432 Centrilobular emphysema: Secondary | ICD-10-CM

## 2023-07-25 LAB — CBC WITH DIFFERENTIAL/PLATELET
Absolute Monocytes: 455 cells/uL (ref 200–950)
Basophils Absolute: 41 cells/uL (ref 0–200)
Basophils Relative: 0.6 %
Eosinophils Absolute: 511 cells/uL — ABNORMAL HIGH (ref 15–500)
Eosinophils Relative: 7.4 %
HCT: 47.3 % (ref 38.5–50.0)
Hemoglobin: 15.3 g/dL (ref 13.2–17.1)
Lymphs Abs: 1718 cells/uL (ref 850–3900)
MCH: 30.1 pg (ref 27.0–33.0)
MCHC: 32.3 g/dL (ref 32.0–36.0)
MCV: 93.1 fL (ref 80.0–100.0)
MPV: 11.4 fL (ref 7.5–12.5)
Monocytes Relative: 6.6 %
Neutro Abs: 4175 cells/uL (ref 1500–7800)
Neutrophils Relative %: 60.5 %
Platelets: 302 10*3/uL (ref 140–400)
RBC: 5.08 10*6/uL (ref 4.20–5.80)
RDW: 12.7 % (ref 11.0–15.0)
Total Lymphocyte: 24.9 %
WBC: 6.9 10*3/uL (ref 3.8–10.8)

## 2023-07-26 LAB — COMPLETE METABOLIC PANEL WITH GFR
AG Ratio: 1.8 (calc) (ref 1.0–2.5)
ALT: 19 U/L (ref 9–46)
AST: 22 U/L (ref 10–35)
Albumin: 4 g/dL (ref 3.6–5.1)
Alkaline phosphatase (APISO): 88 U/L (ref 35–144)
BUN: 19 mg/dL (ref 7–25)
CO2: 26 mmol/L (ref 20–32)
Calcium: 9.1 mg/dL (ref 8.6–10.3)
Chloride: 108 mmol/L (ref 98–110)
Creat: 1.08 mg/dL (ref 0.70–1.22)
Globulin: 2.2 g/dL (ref 1.9–3.7)
Glucose, Bld: 82 mg/dL (ref 65–99)
Potassium: 4.4 mmol/L (ref 3.5–5.3)
Sodium: 141 mmol/L (ref 135–146)
Total Bilirubin: 0.6 mg/dL (ref 0.2–1.2)
Total Protein: 6.2 g/dL (ref 6.1–8.1)
eGFR: 69 mL/min/{1.73_m2} (ref >=60)

## 2023-07-26 LAB — HEMOGLOBIN A1C
Hgb A1c MFr Bld: 5.6 %{Hb} (ref <5.7)
Mean Plasma Glucose: 114 mg/dL
eAG (mmol/L): 6.3 mmol/L

## 2023-07-26 LAB — TSH: TSH: 4.28 m[IU]/L (ref 0.40–4.50)

## 2023-07-30 ENCOUNTER — Ambulatory Visit (INDEPENDENT_AMBULATORY_CARE_PROVIDER_SITE_OTHER): Payer: PPO | Admitting: Family Medicine

## 2023-07-30 ENCOUNTER — Encounter: Payer: Self-pay | Admitting: Family Medicine

## 2023-07-30 ENCOUNTER — Other Ambulatory Visit: Payer: Self-pay | Admitting: Family Medicine

## 2023-07-30 VITALS — BP 140/82 | HR 70 | Ht 71.0 in | Wt 144.0 lb

## 2023-07-30 DIAGNOSIS — I10 Essential (primary) hypertension: Secondary | ICD-10-CM | POA: Diagnosis not present

## 2023-07-30 DIAGNOSIS — Z23 Encounter for immunization: Secondary | ICD-10-CM | POA: Diagnosis not present

## 2023-07-30 DIAGNOSIS — G44209 Tension-type headache, unspecified, not intractable: Secondary | ICD-10-CM | POA: Diagnosis not present

## 2023-07-30 MED ORDER — AMLODIPINE BESYLATE 5 MG PO TABS: 5.0000 mg | ORAL_TABLET | Freq: Every day | ORAL | 2 refills | Status: DC

## 2023-07-30 NOTE — Patient Instructions (Addendum)
Thank you for coming to the office today.  Start new Blood pressure medication Amlodipine 5mg  daily.  Caution side effect with swelling in lower legs, elevate them to treat this.  Okay to take Aleve as needed.  ----------------------   If we are not improving we can order CT Scan  Call if needed   [facility]  Address: [address], [place], [place] [address] Phone: [phone]    Please schedule a Follow-up Appointment to: Return in about 4 weeks (around 08/27/2023) for 4 week follow-up BP, Headache, add CT if unresolved.  If you have any other questions or concerns, please feel free to call the office or send a message through MyChart. You may also schedule an earlier appointment if necessary.  Additionally, you may be receiving a survey about your experience at our office within a few days to 1 week by e-mail or mail. We value your feedback.  Saralyn Pilar, DO [facility], [place]

## 2023-07-30 NOTE — Progress Notes (Signed)
I called and notified patients of his lab results.

## 2023-07-30 NOTE — Progress Notes (Signed)
Subjective:    Patient ID: James [name], male    DOB: [birth year], 82 y.o.   MRN: [id number]  James [name] is a 82 y.o. male presenting on 07/30/2023 for Headache   HPI  Discussed the use of AI scribe software for clinical note transcription with the patient, who gave verbal consent to proceed.  Interval updates from last visit   Follow-up abdominal pain and constipation Last visit 07/22/23 - we performed [facility] X-ray negative. But symptoms classic for constipation. Treated with Miralax course Today reports the abdominal symptoms are significantly improved.  Headaches Hypertension, new diagnosis Last visit on same issue 07/22/23, we discussed headaches, improved with Tylenol. No significant sinus or other symptoms. Tried Baclofen without relief. He has had elevated BP still, repeat reading. Does not check BP at home Not on BP medication Persistent, usually every day for headaches now worse after activity in afternoon Fam history of brother passed with brain cancer at age 68 Tried some OTC aleve x 2 tabs in PM only, some relief worked better than Tylenol Smoking, 8 cig per day. Coffee daily Failed Gabapentin in past for back pain.   Health Maintenance: Flu Shot today     07/30/2023   11:56 AM 07/22/2023   11:55 AM 01/04/2023    2:53 PM  Depression screen PHQ 2/9  Decreased Interest 0 0 0  Down, Depressed, Hopeless 0 0 0  PHQ - 2 Score 0 0 0  Altered sleeping 0 0 0  Tired, decreased energy 0 0 0  Change in appetite 0 0 0  Feeling bad or failure about yourself  0 0 0  Trouble concentrating 0 0 0  Moving slowly or fidgety/restless 0 0 0  Suicidal thoughts 0 0 0  PHQ-9 Score 0 0 0  Difficult doing work/chores Not difficult at all Not difficult at all Not difficult at all    Social History   Tobacco Use   Smoking status: Every Day    Current packs/day: 0.25    Average packs/day: 0.3 packs/day for 50.0 years (12.5 ttl pk-yrs)    Types: Cigarettes   Smokeless  tobacco: Never  Vaping Use   Vaping status: Never Used  Substance Use Topics   Alcohol use: Yes    Alcohol/week: 3.0 standard drinks of alcohol    Types: 3 Cans of beer per week   Drug use: No    Review of Systems Per HPI unless specifically indicated above     Objective:    BP (!) 140/82 (BP Location: Left Arm, Cuff Size: Normal)   Pulse 70   Ht 5\' 11"  (1.803 m)   Wt 144 lb (65.3 kg)   SpO2 96%   BMI 20.08 kg/m   Wt Readings from Last 3 Encounters:  07/30/23 144 lb (65.3 kg)  07/22/23 143 lb (64.9 kg)  06/28/23 142 lb 10.9 oz (64.7 kg)    Physical Exam Vitals and nursing note reviewed.  Constitutional:      General: He is not in acute distress.    Appearance: Normal appearance. He is well-developed. He is not diaphoretic.     Comments: Well-appearing, comfortable, cooperative  HENT:     Head: Normocephalic and atraumatic.     Comments: No sinus tenderness.    Right Ear: Tympanic membrane, ear canal and external ear normal. There is no impacted cerumen.     Left Ear: Tympanic membrane, ear canal and external ear normal. There is no impacted cerumen.  Eyes:  General:        Right eye: No discharge.        Left eye: No discharge.     Conjunctiva/sclera: Conjunctivae normal.  Neck:     Thyroid: No thyromegaly.  Cardiovascular:     Rate and Rhythm: Normal rate and regular rhythm.     Pulses: Normal pulses.     Heart sounds: Normal heart sounds. No murmur heard. Pulmonary:     Effort: Pulmonary effort is normal. No respiratory distress.     Breath sounds: Normal breath sounds. No wheezing or rales.  Musculoskeletal:        General: Normal range of motion.     Cervical back: Normal range of motion and neck supple.     Right lower leg: No edema.     Left lower leg: No edema.  Lymphadenopathy:     Cervical: No cervical adenopathy.  Skin:    General: Skin is warm and dry.     Findings: No erythema or rash.  Neurological:     Mental Status: He is alert and  oriented to person, place, and time. Mental status is at baseline.  Psychiatric:        Mood and Affect: Mood normal.        Behavior: Behavior normal.        Thought Content: Thought content normal.     Comments: Well groomed, good eye contact, normal speech and thoughts    Results for orders placed or performed in visit on 07/25/23  TSH  Result Value Ref Range   TSH 4.28 0.40 - 4.50 mIU/L  CBC with Differential/Platelet  Result Value Ref Range   WBC 6.9 3.8 - 10.8 Thousand/uL   RBC 5.08 4.20 - 5.80 Million/uL   Hemoglobin 15.3 13.2 - 17.1 g/dL   HCT 84.6 96.2 - 95.2 %   MCV 93.1 80.0 - 100.0 fL   MCH 30.1 27.0 - 33.0 pg   MCHC 32.3 32.0 - 36.0 g/dL   RDW 84.1 32.4 - 40.1 %   Platelets 302 140 - 400 Thousand/uL   MPV 11.4 7.5 - 12.5 fL   Neutro Abs 4,175 1,500 - 7,800 cells/uL   Lymphs Abs 1,718 850 - 3,900 cells/uL   Absolute Monocytes 455 200 - 950 cells/uL   Eosinophils Absolute 511 (H) 15 - 500 cells/uL   Basophils Absolute 41 0 - 200 cells/uL   Neutrophils Relative % 60.5 %   Total Lymphocyte 24.9 %   Monocytes Relative 6.6 %   Eosinophils Relative 7.4 %   Basophils Relative 0.6 %  COMPLETE METABOLIC PANEL WITH GFR  Result Value Ref Range   Glucose, Bld 82 65 - 99 mg/dL   BUN 19 7 - 25 mg/dL   Creat 0.27 2.53 - 6.64 mg/dL   eGFR 69 > OR = 60 QI/HKV/4.25Z5   BUN/Creatinine Ratio SEE NOTE: 6 - 22 (calc)   Sodium 141 135 - 146 mmol/L   Potassium 4.4 3.5 - 5.3 mmol/L   Chloride 108 98 - 110 mmol/L   CO2 26 20 - 32 mmol/L   Calcium 9.1 8.6 - 10.3 mg/dL   Total Protein 6.2 6.1 - 8.1 g/dL   Albumin 4.0 3.6 - 5.1 g/dL   Globulin 2.2 1.9 - 3.7 g/dL (calc)   AG Ratio 1.8 1.0 - 2.5 (calc)   Total Bilirubin 0.6 0.2 - 1.2 mg/dL   Alkaline phosphatase (APISO) 88 35 - 144 U/L   AST 22 10 - 35 U/L  ALT 19 9 - 46 U/L  Hemoglobin A1c  Result Value Ref Range   Hgb A1c MFr Bld 5.6 <5.7 % of total Hgb   Mean Plasma Glucose 114 mg/dL   eAG (mmol/L) 6.3 mmol/L       Assessment & Plan:   Problem List Items Addressed This Visit     Essential hypertension - Primary   Relevant Medications   amLODipine (NORVASC) 5 MG tablet   Other Visit Diagnoses     Tension headache       Relevant Medications   amLODipine (NORVASC) 5 MG tablet   Need for influenza vaccination       Relevant Orders   Flu Vaccine Trivalent High Dose (Fluad) (Completed)       Orders Placed This Encounter  Procedures   Flu Vaccine Trivalent High Dose (Fluad)   Assessment and Plan    Constipation Symptoms improved with Miralax treatment. -Continue current regimen.  Chronic Headaches Suspected new dx HYPERTENSION is underlying cause No sign of migraines, or neurological symptoms No sign of sinusitis Persistent daily headaches for approximately 4 weeks, exacerbated by physical activity. No relief with Baclofen or Tylenol, some relief with Aleve.  -Start Amlodipine 5mg  daily for suspected hypertension-related headaches. -Continue Aleve as needed for pain relief. -Consider CT scan if no improvement with blood pressure control. Given his fam history  Hypertension Recent blood pressure readings slightly elevated. -Start Amlodipine 5mg  daily. -Check blood pressure at next visit in 4 weeks.  Follow-up in 4 weeks to assess response to Amlodipine and ongoing headache management.        Meds ordered this encounter  Medications   amLODipine (NORVASC) 5 MG tablet    Sig: Take 1 tablet (5 mg total) by mouth daily.    Dispense:  30 tablet    Refill:  2      Follow up plan: Return in about 4 weeks (around 08/27/2023) for 4 week follow-up BP, Headache, add CT if unresolved.  Saralyn Pilar, DO [facility] 07/30/2023, 11:54 AM

## 2023-08-06 DIAGNOSIS — D2222 Melanocytic nevi of left ear and external auricular canal: Secondary | ICD-10-CM | POA: Diagnosis not present

## 2023-08-06 DIAGNOSIS — D2262 Melanocytic nevi of left upper limb, including shoulder: Secondary | ICD-10-CM | POA: Diagnosis not present

## 2023-08-06 DIAGNOSIS — L570 Actinic keratosis: Secondary | ICD-10-CM | POA: Diagnosis not present

## 2023-08-06 DIAGNOSIS — D2261 Melanocytic nevi of right upper limb, including shoulder: Secondary | ICD-10-CM | POA: Diagnosis not present

## 2023-08-06 DIAGNOSIS — D044 Carcinoma in situ of skin of scalp and neck: Secondary | ICD-10-CM | POA: Diagnosis not present

## 2023-08-06 DIAGNOSIS — D485 Neoplasm of uncertain behavior of skin: Secondary | ICD-10-CM | POA: Diagnosis not present

## 2023-08-06 DIAGNOSIS — D225 Melanocytic nevi of trunk: Secondary | ICD-10-CM | POA: Diagnosis not present

## 2023-08-06 DIAGNOSIS — D2239 Melanocytic nevi of other parts of face: Secondary | ICD-10-CM | POA: Diagnosis not present

## 2023-08-06 DIAGNOSIS — L821 Other seborrheic keratosis: Secondary | ICD-10-CM | POA: Diagnosis not present

## 2023-08-06 DIAGNOSIS — C44519 Basal cell carcinoma of skin of other part of trunk: Secondary | ICD-10-CM | POA: Diagnosis not present

## 2023-08-06 DIAGNOSIS — Z85828 Personal history of other malignant neoplasm of skin: Secondary | ICD-10-CM | POA: Diagnosis not present

## 2023-08-27 ENCOUNTER — Ambulatory Visit (INDEPENDENT_AMBULATORY_CARE_PROVIDER_SITE_OTHER): Payer: PPO | Admitting: Family Medicine

## 2023-08-27 VITALS — BP 128/70 | HR 73 | Ht 71.0 in | Wt 145.0 lb

## 2023-08-27 DIAGNOSIS — N529 Male erectile dysfunction, unspecified: Secondary | ICD-10-CM

## 2023-08-27 DIAGNOSIS — G44209 Tension-type headache, unspecified, not intractable: Secondary | ICD-10-CM | POA: Diagnosis not present

## 2023-08-27 DIAGNOSIS — D044 Carcinoma in situ of skin of scalp and neck: Secondary | ICD-10-CM | POA: Diagnosis not present

## 2023-08-27 DIAGNOSIS — C44329 Squamous cell carcinoma of skin of other parts of face: Secondary | ICD-10-CM | POA: Diagnosis not present

## 2023-08-27 DIAGNOSIS — I10 Essential (primary) hypertension: Secondary | ICD-10-CM

## 2023-08-27 DIAGNOSIS — D485 Neoplasm of uncertain behavior of skin: Secondary | ICD-10-CM | POA: Diagnosis not present

## 2023-08-27 DIAGNOSIS — C44519 Basal cell carcinoma of skin of other part of trunk: Secondary | ICD-10-CM | POA: Diagnosis not present

## 2023-08-27 MED ORDER — AMLODIPINE BESYLATE 5 MG PO TABS: 5.0000 mg | ORAL_TABLET | Freq: Every day | ORAL | 11 refills | Status: DC

## 2023-08-27 NOTE — Patient Instructions (Addendum)
Thank you for coming to the office today.  BP is improved. Seems like the headaches were due to blood pressure.  Refills added, go back to pharmacy when low on Amlodipine 5mg  daily  Try the Tadalafil Cialis to see if effective. If needed, please call back and we can refer you to the Urologist in [place] at [facility] and they can discuss injection or other therapy  [facility] -1st floor [address] [place],  [place]  [address] Phone: [phone]   Please schedule a Follow-up Appointment to: Return if symptoms worsen or fail to improve.  If you have any other questions or concerns, please feel free to call the office or send a message through MyChart. You may also schedule an earlier appointment if necessary.  Additionally, you may be receiving a survey about your experience at our office within a few days to 1 week by e-mail or mail. We value your feedback.  Saralyn Pilar, DO [facility], [place]

## 2023-08-27 NOTE — Progress Notes (Signed)
Subjective:    Patient ID: James [name], male    DOB: 1941/04/01, 82 y.o.   MRN: [id number]  James [name] is a 82 y.o. male presenting on 08/27/2023 for Hypertension and Headache   HPI  Discussed the use of AI scribe software for clinical note transcription with the patient, who gave verbal consent to proceed.  The patient, an 82 year old with a history of hypertension, presented with a follow-up from last visit 1 month ago on 07/30/23 for headaches. He had elevated Bp at that time and started on low dose Amlodipine 5mg  daily for HYPERTENSION and headaches.  Today update is the headaches have nearly resolved and BP is now controlled. However he does admit some mild occasional dizziness.  In addition to the hypertension management, the patient expressed concerns about his sexual health. He has recently started a new relationship and is seeking effective treatment for erectile dysfunction. Previous attempts with Viagra were unsuccessful. The patient was interested in exploring other options, including Cialis and over-the-counter treatments. He has Tadalafil 20mg  already. He plans to try this.       07/30/2023   11:56 AM 07/22/2023   11:55 AM 01/04/2023    2:53 PM  Depression screen PHQ 2/9  Decreased Interest 0 0 0  Down, Depressed, Hopeless 0 0 0  PHQ - 2 Score 0 0 0  Altered sleeping 0 0 0  Tired, decreased energy 0 0 0  Change in appetite 0 0 0  Feeling bad or failure about yourself  0 0 0  Trouble concentrating 0 0 0  Moving slowly or fidgety/restless 0 0 0  Suicidal thoughts 0 0 0  PHQ-9 Score 0 0 0  Difficult doing work/chores Not difficult at all Not difficult at all Not difficult at all    Social History   Tobacco Use   Smoking status: Every Day    Current packs/day: 0.25    Average packs/day: 0.3 packs/day for 50.0 years (12.5 ttl pk-yrs)    Types: Cigarettes   Smokeless tobacco: Never  Vaping Use   Vaping status: Never Used  Substance Use Topics    Alcohol use: Yes    Alcohol/week: 3.0 standard drinks of alcohol    Types: 3 Cans of beer per week   Drug use: No    Review of Systems Per HPI unless specifically indicated above     Objective:    BP 128/70   Pulse 73   Ht 5\' 11"  (1.803 m)   Wt 145 lb (65.8 kg)   SpO2 98%   BMI 20.22 kg/m   Wt Readings from Last 3 Encounters:  08/27/23 145 lb (65.8 kg)  07/30/23 144 lb (65.3 kg)  07/22/23 143 lb (64.9 kg)    Physical Exam Vitals and nursing note reviewed.  Constitutional:      General: He is not in acute distress.    Appearance: Normal appearance. He is well-developed. He is not diaphoretic.     Comments: Well-appearing, comfortable, cooperative  HENT:     Head: Normocephalic and atraumatic.  Eyes:     General:        Right eye: No discharge.        Left eye: No discharge.     Conjunctiva/sclera: Conjunctivae normal.  Cardiovascular:     Rate and Rhythm: Normal rate.  Pulmonary:     Effort: Pulmonary effort is normal.  Skin:    General: Skin is warm and dry.     Findings: No erythema  or rash.  Neurological:     Mental Status: He is alert and oriented to person, place, and time.  Psychiatric:        Mood and Affect: Mood normal.        Behavior: Behavior normal.        Thought Content: Thought content normal.     Comments: Well groomed, good eye contact, normal speech and thoughts       Results for orders placed or performed in visit on 07/25/23  TSH  Result Value Ref Range   TSH 4.28 0.40 - 4.50 mIU/L  CBC with Differential/Platelet  Result Value Ref Range   WBC 6.9 3.8 - 10.8 Thousand/uL   RBC 5.08 4.20 - 5.80 Million/uL   Hemoglobin 15.3 13.2 - 17.1 g/dL   HCT 60.4 54.0 - 98.1 %   MCV 93.1 80.0 - 100.0 fL   MCH 30.1 27.0 - 33.0 pg   MCHC 32.3 32.0 - 36.0 g/dL   RDW 19.1 47.8 - 29.5 %   Platelets 302 140 - 400 Thousand/uL   MPV 11.4 7.5 - 12.5 fL   Neutro Abs 4,175 1,500 - 7,800 cells/uL   Lymphs Abs 1,718 850 - 3,900 cells/uL   Absolute  Monocytes 455 200 - 950 cells/uL   Eosinophils Absolute 511 (H) 15 - 500 cells/uL   Basophils Absolute 41 0 - 200 cells/uL   Neutrophils Relative % 60.5 %   Total Lymphocyte 24.9 %   Monocytes Relative 6.6 %   Eosinophils Relative 7.4 %   Basophils Relative 0.6 %  COMPLETE METABOLIC PANEL WITH GFR  Result Value Ref Range   Glucose, Bld 82 65 - 99 mg/dL   BUN 19 7 - 25 mg/dL   Creat 6.21 3.08 - 6.57 mg/dL   eGFR 69 > OR = 60 QI/ONG/2.95M8   BUN/Creatinine Ratio SEE NOTE: 6 - 22 (calc)   Sodium 141 135 - 146 mmol/L   Potassium 4.4 3.5 - 5.3 mmol/L   Chloride 108 98 - 110 mmol/L   CO2 26 20 - 32 mmol/L   Calcium 9.1 8.6 - 10.3 mg/dL   Total Protein 6.2 6.1 - 8.1 g/dL   Albumin 4.0 3.6 - 5.1 g/dL   Globulin 2.2 1.9 - 3.7 g/dL (calc)   AG Ratio 1.8 1.0 - 2.5 (calc)   Total Bilirubin 0.6 0.2 - 1.2 mg/dL   Alkaline phosphatase (APISO) 88 35 - 144 U/L   AST 22 10 - 35 U/L   ALT 19 9 - 46 U/L  Hemoglobin A1c  Result Value Ref Range   Hgb A1c MFr Bld 5.6 <5.7 % of total Hgb   Mean Plasma Glucose 114 mg/dL   eAG (mmol/L) 6.3 mmol/L      Assessment & Plan:   Problem List Items Addressed This Visit     Erectile dysfunction   Essential hypertension   Relevant Medications   amLODipine (NORVASC) 5 MG tablet   Other Visit Diagnoses     Tension headache    -  Primary   Relevant Medications   amLODipine (NORVASC) 5 MG tablet       Assessment and Plan    Hypertension Improved with new medication. Mild dizziness reported, likely due to blood pressure reduction.  -Continue current medication. Amlodipine 5mg  daily Add refills  Headache - likely secondary to HYPERTENSION. RESOLVED now  Erectile Dysfunction Patient inquiring about alternatives to Viagra and Cialis. New relationship reported. -Try 20mg  Tadalafil (Cialis) and assess effectiveness. -If ineffective, patient to  call back for referral to a urologist for further management options, including possible Trimix  injections.     Meds ordered this encounter  Medications   amLODipine (NORVASC) 5 MG tablet    Sig: Take 1 tablet (5 mg total) by mouth daily.    Dispense:  30 tablet    Refill:  11    Add refills      Follow up plan: Return if symptoms worsen or fail to improve.   Saralyn Pilar, DO [facility] 08/27/2023, 11:14 AM

## 2023-09-10 DIAGNOSIS — D045 Carcinoma in situ of skin of trunk: Secondary | ICD-10-CM | POA: Diagnosis not present

## 2023-09-10 DIAGNOSIS — L989 Disorder of the skin and subcutaneous tissue, unspecified: Secondary | ICD-10-CM | POA: Diagnosis not present

## 2023-09-10 DIAGNOSIS — D485 Neoplasm of uncertain behavior of skin: Secondary | ICD-10-CM | POA: Diagnosis not present

## 2023-09-12 ENCOUNTER — Other Ambulatory Visit: Payer: Self-pay | Admitting: Family Medicine

## 2023-09-12 DIAGNOSIS — K219 Gastro-esophageal reflux disease without esophagitis: Secondary | ICD-10-CM

## 2023-09-12 NOTE — Telephone Encounter (Signed)
Requested Prescriptions  Pending Prescriptions Disp Refills   omeprazole (PRILOSEC) 40 MG capsule [Pharmacy Med Name: OMEPRAZOLE DR 40 MG CAPSULE] 90 capsule 0    Sig: TAKE 1 CAPSULE (40 MG TOTAL) BY MOUTH DAILY.     Gastroenterology: Proton Pump Inhibitors Passed - 09/12/2023  2:43 AM      Passed - Valid encounter within last 12 months    Recent Outpatient Visits           2 weeks ago Tension headache   [facility] [place], Netta Neat, DO   1 month ago Essential hypertension   [facility] [place], Netta Neat, DO   1 month ago Constipation, unspecified constipation type   [facility] Smitty Cords, DO   8 months ago Esophageal dysphagia   [facility] Smitty Cords, DO   9 months ago Annual physical exam   [facility] [place], Netta Neat, [place]

## 2023-09-16 ENCOUNTER — Encounter: Payer: Self-pay | Admitting: *Deleted

## 2023-09-25 ENCOUNTER — Other Ambulatory Visit: Payer: Self-pay

## 2023-10-01 DIAGNOSIS — L728 Other follicular cysts of the skin and subcutaneous tissue: Secondary | ICD-10-CM | POA: Diagnosis not present

## 2023-10-01 DIAGNOSIS — L570 Actinic keratosis: Secondary | ICD-10-CM | POA: Diagnosis not present

## 2023-10-01 DIAGNOSIS — D044 Carcinoma in situ of skin of scalp and neck: Secondary | ICD-10-CM | POA: Diagnosis not present

## 2023-10-04 ENCOUNTER — Encounter: Payer: Self-pay | Admitting: Gastroenterology

## 2023-10-04 ENCOUNTER — Ambulatory Visit: Payer: PPO | Admitting: Certified Registered"

## 2023-10-04 ENCOUNTER — Ambulatory Visit
Admission: RE | Admit: 2023-10-04 | Discharge: 2023-10-04 | Disposition: A | Discharge status: Home / Self Care | Payer: PPO | Source: Ambulatory Visit | Attending: Gastroenterology | Admitting: Gastroenterology

## 2023-10-04 ENCOUNTER — Encounter
Admission: RE | Disposition: A | Discharge status: Home / Self Care | Payer: Self-pay | Source: Ambulatory Visit | Attending: Gastroenterology

## 2023-10-04 DIAGNOSIS — K449 Diaphragmatic hernia without obstruction or gangrene: Secondary | ICD-10-CM | POA: Diagnosis not present

## 2023-10-04 DIAGNOSIS — K222 Esophageal obstruction: Secondary | ICD-10-CM | POA: Insufficient documentation

## 2023-10-04 DIAGNOSIS — J4489 Other specified chronic obstructive pulmonary disease: Secondary | ICD-10-CM | POA: Insufficient documentation

## 2023-10-04 DIAGNOSIS — I10 Essential (primary) hypertension: Secondary | ICD-10-CM | POA: Diagnosis not present

## 2023-10-04 DIAGNOSIS — K219 Gastro-esophageal reflux disease without esophagitis: Secondary | ICD-10-CM | POA: Diagnosis not present

## 2023-10-04 DIAGNOSIS — R1310 Dysphagia, unspecified: Secondary | ICD-10-CM | POA: Diagnosis not present

## 2023-10-04 DIAGNOSIS — F17210 Nicotine dependence, cigarettes, uncomplicated: Secondary | ICD-10-CM | POA: Insufficient documentation

## 2023-10-04 DIAGNOSIS — Z79899 Other long term (current) drug therapy: Secondary | ICD-10-CM | POA: Insufficient documentation

## 2023-10-04 DIAGNOSIS — F0390 Unspecified dementia without behavioral disturbance: Secondary | ICD-10-CM | POA: Diagnosis not present

## 2023-10-04 DIAGNOSIS — F17200 Nicotine dependence, unspecified, uncomplicated: Secondary | ICD-10-CM | POA: Diagnosis not present

## 2023-10-04 HISTORY — PX: ESOPHAGOGASTRODUODENOSCOPY (EGD) WITH PROPOFOL: SHX5813

## 2023-10-04 SURGERY — ESOPHAGOGASTRODUODENOSCOPY (EGD) WITH PROPOFOL
Anesthesia: General

## 2023-10-04 MED ORDER — LIDOCAINE HCL (CARDIAC) PF 100 MG/5ML IV SOSY
PREFILLED_SYRINGE | INTRAVENOUS | Status: DC | PRN
  Administered 2023-10-04: 100 mg via INTRAVENOUS

## 2023-10-04 MED ORDER — PROPOFOL 10 MG/ML IV BOLUS
INTRAVENOUS | Status: DC | PRN
  Administered 2023-10-04: 40 mg via INTRAVENOUS
  Administered 2023-10-04: 100 mg via INTRAVENOUS

## 2023-10-04 MED ORDER — SODIUM CHLORIDE 0.9 % IV SOLN
INTRAVENOUS | Status: DC
  Administered 2023-10-04: 20 mL/h via INTRAVENOUS

## 2023-10-04 NOTE — Op Note (Signed)
[facility] Patient Name: James [name] Procedure Date: 10/04/2023 10:05 AM MRN: [id number] Account #: [id number] Date of Birth: [birth year] Admit Type: Outpatient Age: 82 Room: [facility] ENDO ROOM 3 Gender: Male Note Status: Finalized Instrument Name: Upper Endoscope [id number] Procedure:             Upper GI endoscopy Indications:           Dysphagia, Stricture of the esophagus Providers:             Eather Colas MD, MD Referring MD:          Smitty Cords (Referring MD) Medicines:             Monitored Anesthesia Care Complications:         No immediate complications. Estimated blood loss:                         Minimal. Procedure:             Pre-Anesthesia Assessment:                        - Prior to the procedure, a History and Physical was                         performed, and patient medications and allergies were                         reviewed. The patient is competent. The risks and                         benefits of the procedure and the sedation options and                         risks were discussed with the patient. All questions                         were answered and informed consent was obtained.                         Patient identification and proposed procedure were                         verified by the physician, the nurse, the                         anesthesiologist, the anesthetist and the technician                         in the endoscopy suite. Mental Status Examination:                         alert and oriented. Airway Examination: normal                         oropharyngeal airway and neck mobility. Respiratory                         Examination: clear to auscultation. CV Examination:  normal. Prophylactic Antibiotics: The patient does not                         require prophylactic antibiotics. Prior                         Anticoagulants: The patient has taken no  anticoagulant                         or antiplatelet agents. ASA Grade Assessment: III - A                         patient with severe systemic disease. After reviewing                         the risks and benefits, the patient was deemed in                         satisfactory condition to undergo the procedure. The                         anesthesia plan was to use monitored anesthesia care                         (MAC). Immediately prior to administration of                         medications, the patient was re-assessed for adequacy                         to receive sedatives. The heart rate, respiratory                         rate, oxygen saturations, blood pressure, adequacy of                         pulmonary ventilation, and response to care were                         monitored throughout the procedure. The physical                         status of the patient was re-assessed after the                         procedure.                        After obtaining informed consent, the endoscope was                         passed under direct vision. Throughout the procedure,                         the patient's blood pressure, pulse, and oxygen                         saturations were monitored continuously. The Endoscope  was introduced through the mouth, and advanced to the                         second part of duodenum. The upper GI endoscopy was                         accomplished without difficulty. The patient tolerated                         the procedure well. Findings:      A non-obstructing Schatzki ring was found in the lower third of the       esophagus. A TTS dilator was passed through the scope. Dilation with an       18-19-20 mm balloon dilator was performed to 18 mm. The dilation site       was examined and showed moderate mucosal disruption. Estimated blood       loss was minimal. Similar to previous EGD, the site of mucosal        disruption was more proximal to schatzki ring.      A small hiatal hernia was present.      The entire examined stomach was normal.      The examined duodenum was normal. Impression:            - Non-obstructing Schatzki ring. Dilated.                        - Small hiatal hernia.                        - Normal stomach.                        - Normal examined duodenum.                        - No specimens collected. Recommendation:        - Discharge patient to home.                        - Resume previous diet.                        - Continue present medications.                        - Return to referring physician as previously                         scheduled. Would assess symptoms after most recent                         dilation, if persistent solid/liquid dysphagia, would                         recommend manometry. If improvement in symptoms then                         could monitor clinically. Procedure Code(s):     --- Professional ---  [phone], Esophagogastroduodenoscopy, flexible,                         transoral; with transendoscopic balloon dilation of                         esophagus (less than 30 mm diameter) Diagnosis Code(s):     --- Professional ---                        K22.2, Esophageal obstruction                        K44.9, Diaphragmatic hernia without obstruction or                         gangrene                        R13.10, Dysphagia, unspecified CPT copyright 2022 American Medical Association. All rights reserved. The codes documented in this report are preliminary and upon coder review may  be revised to meet current compliance requirements. Eather Colas MD, MD 10/04/2023 10:24:23 AM Number of Addenda: 0 Note Initiated On: 10/04/2023 10:05 AM Estimated Blood Loss:  Estimated blood loss was minimal.      [facility]

## 2023-10-04 NOTE — Progress Notes (Signed)
James [name] complained of some discomfort in the epigastric area, which improved after belching several times. Dr. Mia Creek was made aware and also talked to patient again prior to him being discharged home.

## 2023-10-04 NOTE — Transfer of Care (Signed)
Immediate Anesthesia Transfer of Care Note  Patient: James [name]  Procedure(s) Performed: ESOPHAGOGASTRODUODENOSCOPY (EGD) WITH PROPOFOL Balloon dilation wire-guided  Patient Location: Endoscopy Unit  Anesthesia Type:General  Level of Consciousness: drowsy  Airway & Oxygen Therapy: Patient Spontanous Breathing  Post-op Assessment: Report given to RN, Post -op Vital signs reviewed and stable, and Patient moving all extremities  Post vital signs: Reviewed and stable  Last Vitals:  Vitals Value Taken Time  BP 142/91 10/04/23 1020  Temp    Pulse 70 10/04/23 1020  Resp    SpO2 97 % 10/04/23 1020  Vitals shown include unfiled device data.  Last Pain:  Vitals:   10/04/23 0926  TempSrc: Temporal  PainSc: 0-No pain         Complications: No notable events documented.

## 2023-10-04 NOTE — Anesthesia Postprocedure Evaluation (Signed)
Anesthesia Post Note  Patient: James [name]  Procedure(s) Performed: ESOPHAGOGASTRODUODENOSCOPY (EGD) WITH PROPOFOL Balloon dilation wire-guided  Patient location during evaluation: PACU Anesthesia Type: General Level of consciousness: awake and awake and alert Pain management: satisfactory to patient Respiratory status: spontaneous breathing Cardiovascular status: blood pressure returned to baseline Anesthetic complications: no   No notable events documented.   Last Vitals:  Vitals:   10/04/23 0926 10/04/23 1020  BP: (!) 159/85 (!) 142/91  Pulse: 62   Resp: 20   Temp: (!) 35.9 C (!) 35.7 C  SpO2: 100%     Last Pain:  Vitals:   10/04/23 1020  TempSrc: Temporal  PainSc:                  VAN STAVEREN,Oretha Weismann

## 2023-10-04 NOTE — Anesthesia Preprocedure Evaluation (Signed)
Anesthesia Evaluation  Patient identified by MRN, date of birth, ID band Patient awake    Reviewed: Allergy & Precautions, NPO status , Patient's Chart, lab work & pertinent test results  Airway Mallampati: III  TM Distance: >3 FB Neck ROM: full    Dental  (+) Partial Upper   Pulmonary neg pulmonary ROS, COPD,  COPD inhaler, Current Smoker and Patient abstained from smoking.   Pulmonary exam normal  + decreased breath sounds      Cardiovascular Exercise Tolerance: Good hypertension, Pt. on medications negative cardio ROS Normal cardiovascular exam Rhythm:Regular Rate:Normal     Neuro/Psych       Dementia negative neurological ROS  negative psych ROS   GI/Hepatic negative GI ROS, Neg liver ROS, hiatal hernia,GERD  ,,  Endo/Other  negative endocrine ROS    Renal/GU negative Renal ROS  negative genitourinary   Musculoskeletal  (+) Arthritis ,    Abdominal Normal abdominal exam  (+)   Peds negative pediatric ROS (+)  Hematology negative hematology ROS (+)   Anesthesia Other Findings Past Medical History: No date: Arthritis No date: Asthma No date: Baker's cyst of knee, right No date: BPH (benign prostatic hyperplasia) No date: Bronchitis No date: Carotid stenosis, left No date: Carpal tunnel syndrome, right No date: COPD (chronic obstructive pulmonary disease) (HCC) No date: Dysphagia No date: Elevated cholesterol No date: Erectile dysfunction No date: Gastritis and duodenitis No date: GERD (gastroesophageal reflux disease) No date: Neuropathy of right forearm No date: Schatzki's ring No date: Tendinitis No date: Vertigo, intermittent No date: Weight loss  Past Surgical History: 12/17/2018: BALLOON DILATION; N/A     Comment:  Procedure: BALLOON DILATION;  Surgeon: Scot Jun, MD;  Location: [facility] ENDOSCOPY;  Service: Endoscopy;                Laterality: N/A; No date: COLONOSCOPY  WITH ESOPHAGOGASTRODUODENOSCOPY (EGD) AND  ESOPHAGEAL DILATION (ED) 10/26/2016: ESOPHAGOGASTRODUODENOSCOPY (EGD) WITH PROPOFOL; N/A     Comment:  Procedure: ESOPHAGOGASTRODUODENOSCOPY (EGD) WITH               PROPOFOL;  Surgeon: Scot Jun, MD;  Location: [facility]              ENDOSCOPY;  Service: Endoscopy;  Laterality: N/A; 12/17/2018: ESOPHAGOGASTRODUODENOSCOPY (EGD) WITH PROPOFOL; N/A     Comment:  Procedure: ESOPHAGOGASTRODUODENOSCOPY (EGD) WITH               PROPOFOL;  Surgeon: Scot Jun, MD;  Location:               [facility] ENDOSCOPY;  Service: Endoscopy;  Laterality: N/A; 06/28/2023: ESOPHAGOGASTRODUODENOSCOPY (EGD) WITH PROPOFOL; N/A     Comment:  Procedure: ESOPHAGOGASTRODUODENOSCOPY (EGD) WITH               PROPOFOL;  Surgeon: Regis Bill, MD;  Location:               [facility] ENDOSCOPY;  Service: Endoscopy;  Laterality: N/A; No date: HERNIA REPAIR 10/26/2016: SAVORY DILATION; N/A     Comment:  Procedure: SAVORY DILATION;  Surgeon: Scot Jun,               MD;  Location: [facility] ENDOSCOPY;  Service: Endoscopy;                Laterality: N/A;  BMI    Body Mass Index: 20.56 kg/m  Reproductive/Obstetrics negative OB ROS                             Anesthesia Physical Anesthesia Plan  ASA: 3  Anesthesia Plan: General   Post-op Pain Management:    Induction: Intravenous  PONV Risk Score and Plan: Propofol infusion and TIVA  Airway Management Planned: Natural Airway and Nasal Cannula  Additional Equipment:   Intra-op Plan:   Post-operative Plan:   Informed Consent: I have reviewed the patients History and Physical, chart, labs and discussed the procedure including the risks, benefits and alternatives for the proposed anesthesia with the patient or authorized representative who has indicated his/her understanding and acceptance.     Dental Advisory Given  Plan Discussed with: CRNA  Anesthesia Plan Comments:         Anesthesia Quick Evaluation

## 2023-10-04 NOTE — Interval H&P Note (Signed)
History and Physical Interval Note:  10/04/2023 10:00 AM  James [name]  has presented today for surgery, with the diagnosis of dysphagia.  The various methods of treatment have been discussed with the patient and family. After consideration of risks, benefits and other options for treatment, the patient has consented to  Procedure(s): ESOPHAGOGASTRODUODENOSCOPY (EGD) WITH PROPOFOL (N/A) as a surgical intervention.  The patient's history has been reviewed, patient examined, no change in status, stable for surgery.  I have reviewed the patient's chart and labs.  Questions were answered to the patient's satisfaction.     Regis Bill  Ok to proceed with EGD

## 2023-10-04 NOTE — H&P (Signed)
Outpatient short stay form Pre-procedure 10/04/2023  Regis Bill, MD  Primary Physician: Smitty Cords, DO  Reason for visit:  Dysphagia  History of present illness:    82 y/o gentleman with history of COPD and recurrent esophageal strictures here for EGD for solid food dysphagia. No blood thinners. No family history of GI malignancies. He smokes 1/2 ppd. No neck surgeries. Had EGD in August and noted some improvement but now with similar symptoms of dysphagia to liquids and solids. Had normal barium swallow.    Current Facility-Administered Medications:    0.9 %  sodium chloride infusion, , Intravenous, Continuous, Zamirah Denny, Rossie Muskrat, MD, Last Rate: 20 mL/hr at 10/04/23 0938, 20 mL/hr at 10/04/23 0938  Medications Prior to Admission  Medication Sig Dispense Refill Last Dose   albuterol (VENTOLIN HFA) 108 (90 Base) MCG/ACT inhaler Inhale 1-2 puffs into the lungs every 4 (four) hours as needed for wheezing or shortness of breath. 8 g 2 Past Week   amLODipine (NORVASC) 5 MG tablet Take 1 tablet (5 mg total) by mouth daily. 30 tablet 11 Past Week   BREO ELLIPTA 100-25 MCG/ACT AEPB Inhale 1 puff into the lungs daily. 60 each 12 Past Week   diclofenac Sodium (VOLTAREN) 1 % GEL diclofenac 1 % topical gel  APPLY TO AFFECTED AREA 4 TIMES A DAY   Past Week   donepezil (ARICEPT) 10 MG tablet Take 10 mg by mouth at bedtime.   Past Week   doxazosin (CARDURA) 2 MG tablet Take 2 mg by mouth daily.   Past Week   fluticasone (FLONASE) 50 MCG/ACT nasal spray Place 2 sprays into both nostrils daily. Use for 4-6 weeks then stop and use seasonally or as needed. 16 g 3 Past Week   Multiple Vitamin (MULTIVITAMIN WITH MINERALS) TABS tablet Take 1 tablet by mouth daily.   Past Week   omeprazole (PRILOSEC) 40 MG capsule TAKE 1 CAPSULE (40 MG TOTAL) BY MOUTH DAILY. 90 capsule 0 10/03/2023   tadalafil (CIALIS) 20 MG tablet Take 1 tablet (20 mg total) by mouth every other day as needed for erectile  dysfunction. 45 tablet 11 Past Week     Allergies  Allergen Reactions   Aspirin Hives, Swelling and Other (See Comments)    Blisters     Past Medical History:  Diagnosis Date   Arthritis    Asthma    Baker's cyst of knee, right    BPH (benign prostatic hyperplasia)    Bronchitis    Carotid stenosis, left    Carpal tunnel syndrome, right    COPD (chronic obstructive pulmonary disease) (HCC)    Dysphagia    Elevated cholesterol    Erectile dysfunction    Gastritis and duodenitis    GERD (gastroesophageal reflux disease)    Neuropathy of right forearm    Schatzki's ring    Tendinitis    Vertigo, intermittent    Weight loss     Review of systems:  Otherwise negative.    Physical Exam  Gen: Alert, oriented. Appears stated age.  HEENT: PERRLA. Lungs: No respiratory distress CV: RRR Abd: soft, benign, no masses Ext: No edema    Planned procedures: Proceed with EGD. The patient understands the nature of the planned procedure, indications, risks, alternatives and potential complications including but not limited to bleeding, infection, perforation, damage to internal organs and possible oversedation/side effects from anesthesia. The patient agrees and gives consent to proceed.  Please refer to procedure notes for findings, recommendations and patient  disposition/instructions.     Regis Bill, MD [facility]

## 2023-10-16 ENCOUNTER — Encounter: Payer: Self-pay | Admitting: Gastroenterology

## 2023-10-17 ENCOUNTER — Other Ambulatory Visit: Payer: Self-pay | Admitting: Family Medicine

## 2023-10-17 DIAGNOSIS — N401 Enlarged prostate with lower urinary tract symptoms: Secondary | ICD-10-CM

## 2023-10-17 NOTE — Telephone Encounter (Signed)
Requested medication (s) are due for refill today - unsure  Requested medication (s) are on the active medication list -not at dose requested  Future visit scheduled -no  Last refill: unsure  Notes to clinic: Dose requested is different from current medication list - dose lsited on current list is historical. This may be discontinued medication- but wanted PCP review before refusing  Requested Prescriptions  Pending Prescriptions Disp Refills   doxazosin (CARDURA) 4 MG tablet [Pharmacy Med Name: DOXAZOSIN MESYLATE 4 MG TAB] 90 tablet 3    Sig: TAKE 1 TABLET BY MOUTH EVERY DAY     Cardiovascular:  Alpha Blockers Failed - 10/17/2023 10:13 AM      Failed - Last BP in normal range    BP Readings from Last 1 Encounters:  10/04/23 (!) 142/91         Passed - Valid encounter within last 6 months    Recent Outpatient Visits           1 month ago Tension headache   [place] [facility] [place], Netta Neat, DO   2 months ago Essential hypertension   [place] [facility] [place], Netta Neat, DO   2 months ago Constipation, unspecified constipation type   [place] [facility] Smitty Cords, DO   10 months ago Esophageal dysphagia   [place] [facility] Smitty Cords, DO   10 months ago Annual physical exam   [place] [facility] Smitty Cords, DO                 Requested Prescriptions  Pending Prescriptions Disp Refills   doxazosin (CARDURA) 4 MG tablet [Pharmacy Med Name: DOXAZOSIN MESYLATE 4 MG TAB] 90 tablet 3    Sig: TAKE 1 TABLET BY MOUTH EVERY DAY     Cardiovascular:  Alpha Blockers Failed - 10/17/2023 10:13 AM      Failed - Last BP in normal range    BP Readings from Last 1 Encounters:  10/04/23 (!) 142/91         Passed - Valid encounter within last 6 months    Recent Outpatient Visits           1 month ago Tension  headache   [place] [facility] [place], Netta Neat, DO   2 months ago Essential hypertension   [place] [facility] [place], Netta Neat, DO   2 months ago Constipation, unspecified constipation type   [facility] Smitty Cords, DO   10 months ago Esophageal dysphagia    [facility] [place], Netta Neat, DO   10 months ago Annual physical exam   [facility] [place], Netta Neat, [place]

## 2023-12-04 ENCOUNTER — Other Ambulatory Visit: Payer: Self-pay | Admitting: Family Medicine

## 2023-12-04 DIAGNOSIS — G44209 Tension-type headache, unspecified, not intractable: Secondary | ICD-10-CM

## 2023-12-05 NOTE — Telephone Encounter (Signed)
 Requested Prescriptions  Refused Prescriptions Disp Refills   baclofen  (LIORESAL ) 10 MG tablet [Pharmacy Med Name: BACLOFEN  10 MG TABLET] 30 tablet     Sig: TAKE 1/2 TO 1 TABLET (5-10 MG TOTAL) BY MOUTH AT BEDTIME AS NEEDED FOR MUSCLE SPASMS (HEADACHES).     Analgesics:  Muscle Relaxants - baclofen  Passed - 12/05/2023  4:26 PM      Passed - Cr in normal range and within 180 days    Creat  Date Value Ref Range Status  07/25/2023 1.08 0.70 - 1.22 mg/dL Final         Passed - eGFR is 30 or above and within 180 days    GFR calc Af Amer  Date Value Ref Range Status  12/12/2018 >60 >60 mL/min Final   GFR calc non Af Amer  Date Value Ref Range Status  12/12/2018 >60 >60 mL/min Final   eGFR  Date Value Ref Range Status  07/25/2023 69 > OR = 60 mL/min/1.75m2 Final         Passed - Valid encounter within last 6 months    Recent Outpatient Visits           3 months ago Tension headache   [place] [facility] [place] Marsa PARAS, DO   4 months ago Essential hypertension   [place] [facility] [place], Marsa PARAS, DO   4 months ago Constipation, unspecified constipation type   [facility] [place] Marsa PARAS, DO   11 months ago Esophageal dysphagia   [place] [facility] [place] Marsa PARAS, DO   11 months ago Annual physical exam   [facility] [place], Marsa PARAS, [place]

## 2023-12-08 ENCOUNTER — Other Ambulatory Visit: Payer: Self-pay | Admitting: Family Medicine

## 2023-12-08 DIAGNOSIS — K219 Gastro-esophageal reflux disease without esophagitis: Secondary | ICD-10-CM

## 2023-12-09 NOTE — Telephone Encounter (Signed)
 Requested Prescriptions  Pending Prescriptions Disp Refills   omeprazole  (PRILOSEC) 40 MG capsule [Pharmacy Med Name: OMEPRAZOLE  DR 40 MG CAPSULE] 90 capsule 0    Sig: TAKE 1 CAPSULE (40 MG TOTAL) BY MOUTH DAILY.     Gastroenterology: Proton Pump Inhibitors Passed - 12/09/2023  4:29 PM      Passed - Valid encounter within last 12 months    Recent Outpatient Visits           3 months ago Tension headache   [place] [facility] [place], Kayleen Party, DO   4 months ago Essential hypertension   [place] [facility] [place], Kayleen Party, DO   4 months ago Constipation, unspecified constipation type   [facility] Raina Bunting, DO   11 months ago Esophageal dysphagia   [place] [facility] Raina Bunting, DO   12 months ago Annual physical exam   [facility] [place] [place], Kayleen Party, [place]

## 2024-01-06 ENCOUNTER — Other Ambulatory Visit: Payer: Self-pay | Admitting: Family Medicine

## 2024-01-06 DIAGNOSIS — J432 Centrilobular emphysema: Secondary | ICD-10-CM

## 2024-01-07 NOTE — Telephone Encounter (Signed)
 Requested Prescriptions  Pending Prescriptions Disp Refills   BREO ELLIPTA 100-25 MCG/ACT AEPB [Pharmacy Med Name: BREO ELLIPTA 100-25 MCG INHALR] 60 each 0    Sig: TAKE 1 PUFF BY MOUTH EVERY DAY     Pulmonology:  Combination Products Passed - 01/07/2024 11:17 AM      Passed - Valid encounter within last 12 months    Recent Outpatient Visits           4 months ago Tension headache   [place] [facility] [place], Netta Neat, DO   5 months ago Essential hypertension    [facility] [place], Netta Neat, DO   5 months ago Constipation, unspecified constipation type   [facility] Smitty Cords, DO   1 year ago Esophageal dysphagia    [facility] Smitty C[facility], DO   1 year ago Annual physical exam   [facility] Smitty Cords, [place]               '

## 2024-02-25 ENCOUNTER — Other Ambulatory Visit: Payer: Self-pay | Admitting: Family Medicine

## 2024-02-25 DIAGNOSIS — C44311 Basal cell carcinoma of skin of nose: Secondary | ICD-10-CM | POA: Diagnosis not present

## 2024-02-25 DIAGNOSIS — D2262 Melanocytic nevi of left upper limb, including shoulder: Secondary | ICD-10-CM | POA: Diagnosis not present

## 2024-02-25 DIAGNOSIS — D485 Neoplasm of uncertain behavior of skin: Secondary | ICD-10-CM | POA: Diagnosis not present

## 2024-02-25 DIAGNOSIS — L570 Actinic keratosis: Secondary | ICD-10-CM | POA: Diagnosis not present

## 2024-02-25 DIAGNOSIS — K219 Gastro-esophageal reflux disease without esophagitis: Secondary | ICD-10-CM

## 2024-02-25 DIAGNOSIS — L821 Other seborrheic keratosis: Secondary | ICD-10-CM | POA: Diagnosis not present

## 2024-02-25 DIAGNOSIS — D2261 Melanocytic nevi of right upper limb, including shoulder: Secondary | ICD-10-CM | POA: Diagnosis not present

## 2024-02-25 DIAGNOSIS — Z85828 Personal history of other malignant neoplasm of skin: Secondary | ICD-10-CM | POA: Diagnosis not present

## 2024-02-25 DIAGNOSIS — C44329 Squamous cell carcinoma of skin of other parts of face: Secondary | ICD-10-CM | POA: Diagnosis not present

## 2024-02-25 DIAGNOSIS — Z08 Encounter for follow-up examination after completed treatment for malignant neoplasm: Secondary | ICD-10-CM | POA: Diagnosis not present

## 2024-02-25 DIAGNOSIS — D225 Melanocytic nevi of trunk: Secondary | ICD-10-CM | POA: Diagnosis not present

## 2024-02-27 NOTE — Telephone Encounter (Signed)
 Last OV 08/27/23 Requested Prescriptions  Pending Prescriptions Disp Refills   omeprazole  (PRILOSEC) 40 MG capsule [Pharmacy Med Name: OMEPRAZOLE  DR 40 MG CAPSULE] 90 capsule 0    Sig: TAKE 1 CAPSULE (40 MG TOTAL) BY MOUTH DAILY.     Gastroenterology: Proton Pump Inhibitors Failed - 02/27/2024  2:47 PM      Failed - Valid encounter within last 12 months    Recent Outpatient Visits   None

## 2024-03-15 ENCOUNTER — Other Ambulatory Visit: Payer: Self-pay | Admitting: Family Medicine

## 2024-03-15 DIAGNOSIS — J432 Centrilobular emphysema: Secondary | ICD-10-CM

## 2024-03-17 NOTE — Telephone Encounter (Signed)
 OFFICE VISIT NEEDED FOR ADDITIONAL REFILLS.  Requested Prescriptions  Pending Prescriptions Disp Refills   BREO ELLIPTA  100-25 MCG/ACT AEPB [Pharmacy Med Name: BREO ELLIPTA  100-25 MCG INHALR] 30 each 0    Sig: TAKE 1 PUFF BY MOUTH EVERY DAY     Pulmonology:  Combination Products Failed - 03/17/2024  3:46 PM      Failed - Valid encounter within last 12 months    Recent Outpatient Visits   None

## 2024-04-08 ENCOUNTER — Encounter: Payer: Self-pay | Admitting: Family Medicine

## 2024-04-08 ENCOUNTER — Ambulatory Visit (INDEPENDENT_AMBULATORY_CARE_PROVIDER_SITE_OTHER): Admitting: Family Medicine

## 2024-04-08 VITALS — BP 130/80 | HR 76 | Ht 71.0 in | Wt 147.2 lb

## 2024-04-08 DIAGNOSIS — M25541 Pain in joints of right hand: Secondary | ICD-10-CM | POA: Diagnosis not present

## 2024-04-08 DIAGNOSIS — M545 Low back pain, unspecified: Secondary | ICD-10-CM

## 2024-04-08 DIAGNOSIS — M4726 Other spondylosis with radiculopathy, lumbar region: Secondary | ICD-10-CM

## 2024-04-08 DIAGNOSIS — M25542 Pain in joints of left hand: Secondary | ICD-10-CM | POA: Diagnosis not present

## 2024-04-08 DIAGNOSIS — G8929 Other chronic pain: Secondary | ICD-10-CM

## 2024-04-08 MED ORDER — MELOXICAM 7.5 MG PO TABS: 7.5000 mg | ORAL_TABLET | Freq: Every day | ORAL | 0 refills | Status: DC

## 2024-04-08 NOTE — Progress Notes (Signed)
 Subjective:    Patient ID: James [name], male    DOB: 11-06-1940, 83 y.o.   MRN: [id number]  James [name] is a 83 y.o. male presenting on 04/08/2024 for Back Pain   HPI  Discussed the use of AI scribe software for clinical note transcription with the patient, who gave verbal consent to proceed.  History of Present Illness   James [name] is an 83 year old male with chronic back pain and arthritis who presents for evaluation of worsening symptoms.  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 The pain is severe when bending or moving, described as sharp and burning, particularly when standing or walking. It is less severe when sitting. A lumbar x-ray from February 2024 confirmed multiple levels of degenerative arthritis without fractures.  He experiences stiffness and soreness in his arms and hands, which he attributes to arthritis. This stiffness has significantly impacted his ability to do more fine motor skills. His hands become stiff and sore, limiting his ability to perform tasks for more than 10-15 minutes at a time. He has not experienced much pain in his hands, but the stiffness is more aggravating than painful.  He uses Aleve occasionally, approximately every eight weeks, but reports minimal relief. He has not tried any other over-the-counter medications regularly. He has a history of an allergic reaction to aspirin but tolerates Aleve without issues. He has not used stronger pain medications due to a previous adverse experience.  He mentions a past incident where he had a rib and vertebrae injury, which required pain management and injections. He has not been on any memory medication for at least two years due to side effects experienced in the past.           07/30/2023   11:56 AM 07/22/2023   11:55 AM 01/04/2023    2:53 PM  Depression screen PHQ 2/9  Decreased Interest 0 0 0  Down, Depressed,  Hopeless 0 0 0  PHQ - 2 Score 0 0 0  Altered sleeping 0 0 0  Tired, decreased energy 0 0 0  Change in appetite 0 0 0  Feeling bad or failure about yourself  0 0 0  Trouble concentrating 0 0 0  Moving slowly or fidgety/restless 0 0 0  Suicidal thoughts 0 0 0  PHQ-9 Score 0 0 0  Difficult doing work/chores Not difficult at all Not difficult at all Not difficult at all       07/30/2023   11:56 AM 07/22/2023   11:55 AM 06/22/2022   10:39 AM  GAD 7 : Generalized Anxiety Score  Nervous, Anxious, on Edge 0 0 0  Control/stop worrying 0 0 0  Worry too much - different things 0 0 0  Trouble relaxing 0 0 0  Restless 0 0 0  Easily annoyed or irritable 0 0 0  Afraid - awful might happen 0 0 0  Total GAD 7 Score 0 0 0  Anxiety Difficulty  Not difficult at all Not difficult at all    Social History   Tobacco Use   Smoking status: Every Day    Current packs/day: 0.25    Average packs/day: 0.3 packs/day for 50.0 years (12.5 ttl pk-yrs)    Types: Cigarettes   Smokeless tobacco: Never  Vaping Use   Vaping status: Never Used  Substance Use Topics   Alcohol use: Yes    Alcohol/week: 3.0 standard drinks  of alcohol    Types: 3 Cans of beer per week   Drug use: No    Review of Systems Per HPI unless specifically indicated above     Objective:     BP 130/80 (BP Location: Left Arm, Patient Position: Sitting, Cuff Size: Normal)   Pulse 76   Ht 5' 11 (1.803 m)   Wt 147 lb 4 oz (66.8 kg)   SpO2 96%   BMI 20.54 kg/m   Wt Readings from Last 3 Encounters:  04/08/24 147 lb 4 oz (66.8 kg)  10/04/23 147 lb 6.4 oz (66.9 kg)  08/27/23 145 lb (65.8 kg)    Physical Exam Vitals and nursing note reviewed.  Constitutional:      General: He is not in acute distress.    Appearance: Normal appearance. He is well-developed. He is not diaphoretic.     Comments: Well-appearing, comfortable, cooperative  HENT:     Head: Normocephalic and atraumatic.  Eyes:     General:        Right eye: No  discharge.        Left eye: No discharge.     Conjunctiva/sclera: Conjunctivae normal.  Cardiovascular:     Rate and Rhythm: Normal rate.  Pulmonary:     Effort: Pulmonary effort is normal.  Musculoskeletal:     Comments: Low back pain - thin body habitus, muscle hypertonicity bilateral lumbar paraspinal, pain localized to SI lower lumbar region R>L, some stiffness reduced ROM flexion extension.  Skin:    General: Skin is warm and dry.     Findings: No erythema or rash.  Neurological:     Mental Status: He is alert and oriented to person, place, and time.  Psychiatric:        Mood and Affect: Mood normal.        Behavior: Behavior normal.        Thought Content: Thought content normal.     Comments: Well groomed, [name] eye contact, normal speech and thoughts     I have personally reviewed the radiology report from 12/12/22 on Lumbar X-ray  Narrative & Impression  CLINICAL DATA:  chronic low back pain without sciatica   EXAM: LUMBAR SPINE - COMPLETE 4+ VIEW   COMPARISON:  MRI lumbar spine 01/11/2017   FINDINGS: Limited evaluation due to overlapping osseous structures and overlying soft tissues. Five non-rib-bearing lumbar vertebral bodies. No definite acute displaced fracture. Multilevel severe degenerative changes spine with multilevel chronic vertebral body height loss. Patient is rotated on frontal view with query some curvature of the lumbar spine. Multilevel intervertebral disc space narrowing-severe.   Atherosclerotic plaque.   IMPRESSION: 1. Multilevel severe degenerative changes spine with multilevel chronic vertebral body height loss. No definite acute displaced fracture. Limited evaluation due to overlapping osseous structures and overlying soft tissues. Patient is rotated on frontal view. Recommend cross-sectional imaging for further evaluation. 2.  Aortic Atherosclerosis (ICD10-I70.0).     Electronically Signed   By: Morgane  Naveau M.D.   On: 12/14/2022  22:52    Results for orders placed or performed in visit on 07/25/23  TSH   Collection Time: 07/25/23  8:38 AM  Result Value Ref Range   TSH 4.28 0.40 - 4.50 mIU/L  CBC with Differential/Platelet   Collection Time: 07/25/23  8:38 AM  Result Value Ref Range   WBC 6.9 3.8 - 10.8 Thousand/uL   RBC 5.08 4.20 - 5.80 Million/uL   Hemoglobin 15.3 13.2 - 17.1 g/dL   HCT 29.5 28.4 -  50.0 %   MCV 93.1 80.0 - 100.0 fL   MCH 30.1 27.0 - 33.0 pg   MCHC 32.3 32.0 - 36.0 g/dL   RDW 40.9 81.1 - 91.4 %   Platelets 302 140 - 400 Thousand/uL   MPV 11.4 7.5 - 12.5 fL   Neutro Abs 4,175 1,500 - 7,800 cells/uL   Lymphs Abs 1,718 850 - 3,900 cells/uL   Absolute Monocytes 455 200 - 950 cells/uL   Eosinophils Absolute 511 (H) 15 - 500 cells/uL   Basophils Absolute 41 0 - 200 cells/uL   Neutrophils Relative % 60.5 %   Total Lymphocyte 24.9 %   Monocytes Relative 6.6 %   Eosinophils Relative 7.4 %   Basophils Relative 0.6 %  COMPLETE METABOLIC PANEL WITH GFR   Collection Time: 07/25/23  8:38 AM  Result Value Ref Range   Glucose, Bld 82 65 - 99 mg/dL   BUN 19 7 - 25 mg/dL   Creat 7.82 9.56 - 2.13 mg/dL   eGFR 69 > OR = 60 YQ/MVH/8.46N6   BUN/Creatinine Ratio SEE NOTE: 6 - 22 (calc)   Sodium 141 135 - 146 mmol/L   Potassium 4.4 3.5 - 5.3 mmol/L   Chloride 108 98 - 110 mmol/L   CO2 26 20 - 32 mmol/L   Calcium 9.1 8.6 - 10.3 mg/dL   Total Protein 6.2 6.1 - 8.1 g/dL   Albumin 4.0 3.6 - 5.1 g/dL   Globulin 2.2 1.9 - 3.7 g/dL (calc)   AG Ratio 1.8 1.0 - 2.5 (calc)   Total Bilirubin 0.6 0.2 - 1.2 mg/dL   Alkaline phosphatase (APISO) 88 35 - 144 U/L   AST 22 10 - 35 U/L   ALT 19 9 - 46 U/L  Hemoglobin A1c   Collection Time: 07/25/23  8:38 AM  Result Value Ref Range   Hgb A1c MFr Bld 5.6 <5.7 % of total Hgb   Mean Plasma Glucose 114 mg/dL   eAG (mmol/L) 6.3 mmol/L      Assessment & Plan:   Problem List Items Addressed This Visit     Low back pain - Primary   Relevant Medications    meloxicam (MOBIC) 7.5 MG tablet   Osteoarthritis of spine with radiculopathy, lumbar region   Relevant Medications   meloxicam (MOBIC) 7.5 MG tablet   Other Visit Diagnoses       Arthralgia of hands, bilateral            Degenerative Arthritis of the Lumbar Spine Chronic lumbar spine arthritis with multiple level degeneration confirmed by x-ray. Symptoms include pain with movement and sciatic nerve involvement.   - Prescribed meloxicam 7.5 mg once daily with a meal. - Discontinued Aleve, naproxen, ibuprofen, and Advil. - Recommended acetaminophen for additional pain relief. Dose increase 1000mg  TID - Advised use of a heating pad for muscle relaxation. - Consider muscle relaxant in future, caution sedation and side effects  Degenerative Arthritis of the Hands Degenerative arthritis in hands causing stiffness and reduced function. Symptom management with Voltaren gel and heat therapy. - Recommend Voltaren gel up to four times daily. - Advise heat therapy, especially in the morning.  General Health Maintenance Routine blood work, including glucose, checked annually. No current concerns. - Plan routine blood work, including glucose, in September or October.        No orders of the defined types were placed in this encounter.   Meds ordered this encounter  Medications   meloxicam (MOBIC) 7.5 MG tablet  Sig: Take 1 tablet (7.5 mg total) by mouth daily.    Dispense:  30 tablet    Refill:  0    Follow up plan: Return if symptoms worsen or fail to improve.   Domingo Friend, DO [facility] 04/08/2024, 4:00 PM

## 2024-04-08 NOTE — Patient Instructions (Addendum)
 Thank you for coming to the office today.  For the back and hands, arthritis  Take meloxicam 7.5mg  once daily with meal. Stop Aleve / naproxen / ibuprofen advil. While on this medication.  If you need more let me know call back for refills. If it works.  Ideally good to take this medication for 1-2 weeks then pause and then restart again.  For back pain Okay to take Tylenol Extra Strength 500mg  x 2 = 1000mg  3 times per day.  START anti inflammatory topical - OTC Voltaren (generic Diclofenac) topical 2-4 times a day as needed for pain swelling of affected joint for 1-2 weeks or longer.  Recommend heating pad or warm up muscle rub for hands in morning if stiffness  Heating pad is good for back as well.  If still having pain and stiffness, we can consider order muscle relaxant or stronger pain meds.  Please schedule a Follow-up Appointment to: Return if symptoms worsen or fail to improve.  If you have any other questions or concerns, please feel free to call the office or send a message through MyChart. You may also schedule an earlier appointment if necessary.  Additionally, you may be receiving a survey about your experience at our office within a few days to 1 week by e-mail or mail. We value your feedback.  Domingo Friend, DO [facility], [place]

## 2024-04-21 ENCOUNTER — Other Ambulatory Visit: Payer: Self-pay | Admitting: Family Medicine

## 2024-04-21 DIAGNOSIS — J432 Centrilobular emphysema: Secondary | ICD-10-CM

## 2024-04-22 NOTE — Telephone Encounter (Signed)
 OV 04/08/24 Requested Prescriptions  Pending Prescriptions Disp Refills   BREO ELLIPTA  100-25 MCG/ACT AEPB [Pharmacy Med Name: BREO ELLIPTA  100-25 MCG INHALR] 60 each     Sig: INHALE 1 PUFF BY MOUTH EVERY DAY     Pulmonology:  Combination Products Failed - 04/22/2024  1:39 PM      Failed - Valid encounter within last 12 months    Recent Outpatient Visits           2 weeks ago Chronic bilateral low back pain without sciatica   [place] [facility] [place], Marsa PARAS, [place]

## 2024-04-23 DIAGNOSIS — Z886 Allergy status to analgesic agent status: Secondary | ICD-10-CM | POA: Diagnosis not present

## 2024-04-23 DIAGNOSIS — L03113 Cellulitis of right upper limb: Secondary | ICD-10-CM | POA: Diagnosis not present

## 2024-04-23 DIAGNOSIS — S41151A Open bite of right upper arm, initial encounter: Secondary | ICD-10-CM | POA: Diagnosis not present

## 2024-05-05 ENCOUNTER — Other Ambulatory Visit: Payer: Self-pay | Admitting: Family Medicine

## 2024-05-05 DIAGNOSIS — M4726 Other spondylosis with radiculopathy, lumbar region: Secondary | ICD-10-CM

## 2024-05-05 DIAGNOSIS — M545 Low back pain, unspecified: Secondary | ICD-10-CM

## 2024-05-05 NOTE — Telephone Encounter (Unsigned)
 Copied from CRM [id number]. Topic: Clinical - Medication Refill >> May 05, 2024  1:14 PM Yolanda T wrote: Medication:  meloxicam  (MOBIC ) 7.5 MG tablet   Has the patient contacted their pharmacy? No  This is the patient's preferred pharmacy:  [facility] [address] [place] - [address] [place] [address] Phone: [phone] Fax: [phone]  Is this the correct pharmacy for this prescription? Yes  Has the prescription been filled recently? Yes  Is the patient out of the medication? Yes  Has the patient been seen for an appointment in the last year OR does the patient have an upcoming appointment? Yes  Can we respond through MyChart? No  Agent: Please be advised that Rx refills may take up to 3 business days. We ask that you follow-up with your pharmacy.  Patient is requesting a stronger milligram of the meloxicam 

## 2024-05-07 NOTE — Telephone Encounter (Signed)
 Requested by patient . Receipt confirmed by pharmacy 05/07/24 at 11:17 am . Duplicate request.  Requested Prescriptions  Refused Prescriptions Disp Refills   meloxicam  (MOBIC ) 7.5 MG tablet 30 tablet 0    Sig: Take 1 tablet (7.5 mg total) by mouth daily.     Analgesics:  COX2 Inhibitors Failed - 05/07/2024  2:21 PM      Failed - Manual Review: Labs are only required if the patient has taken medication for more than 8 weeks.      Passed - HGB in normal range and within 360 days    Hemoglobin  Date Value Ref Range Status  07/25/2023 15.3 13.2 - 17.1 g/dL Final         Passed - Cr in normal range and within 360 days    Creat  Date Value Ref Range Status  07/25/2023 1.08 0.70 - 1.22 mg/dL Final         Passed - HCT in normal range and within 360 days    HCT  Date Value Ref Range Status  07/25/2023 47.3 38.5 - 50.0 % Final         Passed - AST in normal range and within 360 days    AST  Date Value Ref Range Status  07/25/2023 22 10 - 35 U/L Final         Passed - ALT in normal range and within 360 days    ALT  Date Value Ref Range Status  07/25/2023 19 9 - 46 U/L Final         Passed - eGFR is 30 or above and within 360 days    GFR calc Af Amer  Date Value Ref Range Status  12/12/2018 >60 >60 mL/min Final   GFR calc non Af Amer  Date Value Ref Range Status  12/12/2018 >60 >60 mL/min Final   eGFR  Date Value Ref Range Status  07/25/2023 69 > OR = 60 mL/min/1.47m2 Final         Passed - Patient is not pregnant      Passed - Valid encounter within last 12 months    Recent Outpatient Visits           4 weeks ago Chronic bilateral low back pain without sciatica   [place] [facility] [place], Marsa PARAS, [place]

## 2024-05-07 NOTE — Telephone Encounter (Signed)
 Requested Prescriptions  Pending Prescriptions Disp Refills   meloxicam  (MOBIC ) 7.5 MG tablet [Pharmacy Med Name: MELOXICAM  7.5 MG TABLET] 30 tablet 0    Sig: TAKE 1 TABLET BY MOUTH EVERY DAY     Analgesics:  COX2 Inhibitors Failed - 05/07/2024 11:16 AM      Failed - Manual Review: Labs are only required if the patient has taken medication for more than 8 weeks.      Passed - HGB in normal range and within 360 days    Hemoglobin  Date Value Ref Range Status  07/25/2023 15.3 13.2 - 17.1 g/dL Final         Passed - Cr in normal range and within 360 days    Creat  Date Value Ref Range Status  07/25/2023 1.08 0.70 - 1.22 mg/dL Final         Passed - HCT in normal range and within 360 days    HCT  Date Value Ref Range Status  07/25/2023 47.3 38.5 - 50.0 % Final         Passed - AST in normal range and within 360 days    AST  Date Value Ref Range Status  07/25/2023 22 10 - 35 U/L Final         Passed - ALT in normal range and within 360 days    ALT  Date Value Ref Range Status  07/25/2023 19 9 - 46 U/L Final         Passed - eGFR is 30 or above and within 360 days    GFR calc Af Amer  Date Value Ref Range Status  12/12/2018 >60 >60 mL/min Final   GFR calc non Af Amer  Date Value Ref Range Status  12/12/2018 >60 >60 mL/min Final   eGFR  Date Value Ref Range Status  07/25/2023 69 > OR = 60 mL/min/1.50m2 Final         Passed - Patient is not pregnant      Passed - Valid encounter within last 12 months    Recent Outpatient Visits           4 weeks ago Chronic bilateral low back pain without sciatica   [place] [facility] [place], Marsa PARAS, [place]

## 2024-05-26 ENCOUNTER — Other Ambulatory Visit: Payer: Self-pay | Admitting: Family Medicine

## 2024-05-26 DIAGNOSIS — K219 Gastro-esophageal reflux disease without esophagitis: Secondary | ICD-10-CM

## 2024-05-27 NOTE — Telephone Encounter (Signed)
 Requested Prescriptions  Pending Prescriptions Disp Refills   omeprazole  (PRILOSEC) 40 MG capsule [Pharmacy Med Name: OMEPRAZOLE  DR 40 MG CAPSULE] 90 capsule 0    Sig: TAKE 1 CAPSULE (40 MG TOTAL) BY MOUTH DAILY.     Gastroenterology: Proton Pump Inhibitors Passed - 05/27/2024  9:49 AM      Passed - Valid encounter within last 12 months    Recent Outpatient Visits           1 month ago Chronic bilateral low back pain without sciatica   [place] [facility] [place], Marsa PARAS, [place]

## 2024-07-23 DIAGNOSIS — L820 Inflamed seborrheic keratosis: Secondary | ICD-10-CM | POA: Diagnosis not present

## 2024-07-23 DIAGNOSIS — C44311 Basal cell carcinoma of skin of nose: Secondary | ICD-10-CM | POA: Diagnosis not present

## 2024-07-23 DIAGNOSIS — D0462 Carcinoma in situ of skin of left upper limb, including shoulder: Secondary | ICD-10-CM | POA: Diagnosis not present

## 2024-07-23 DIAGNOSIS — C44329 Squamous cell carcinoma of skin of other parts of face: Secondary | ICD-10-CM | POA: Diagnosis not present

## 2024-07-23 DIAGNOSIS — L570 Actinic keratosis: Secondary | ICD-10-CM | POA: Diagnosis not present

## 2024-07-23 DIAGNOSIS — D2261 Melanocytic nevi of right upper limb, including shoulder: Secondary | ICD-10-CM | POA: Diagnosis not present

## 2024-07-23 DIAGNOSIS — L821 Other seborrheic keratosis: Secondary | ICD-10-CM | POA: Diagnosis not present

## 2024-07-23 DIAGNOSIS — D485 Neoplasm of uncertain behavior of skin: Secondary | ICD-10-CM | POA: Diagnosis not present

## 2024-07-23 DIAGNOSIS — D0461 Carcinoma in situ of skin of right upper limb, including shoulder: Secondary | ICD-10-CM | POA: Diagnosis not present

## 2024-07-23 DIAGNOSIS — D225 Melanocytic nevi of trunk: Secondary | ICD-10-CM | POA: Diagnosis not present

## 2024-07-23 DIAGNOSIS — D2262 Melanocytic nevi of left upper limb, including shoulder: Secondary | ICD-10-CM | POA: Diagnosis not present

## 2024-07-23 DIAGNOSIS — Z08 Encounter for follow-up examination after completed treatment for malignant neoplasm: Secondary | ICD-10-CM | POA: Diagnosis not present

## 2024-07-23 DIAGNOSIS — Z85828 Personal history of other malignant neoplasm of skin: Secondary | ICD-10-CM | POA: Diagnosis not present

## 2024-08-06 DIAGNOSIS — M2011 Hallux valgus (acquired), right foot: Secondary | ICD-10-CM | POA: Diagnosis not present

## 2024-08-06 DIAGNOSIS — L600 Ingrowing nail: Secondary | ICD-10-CM | POA: Diagnosis not present

## 2024-08-06 DIAGNOSIS — M2042 Other hammer toe(s) (acquired), left foot: Secondary | ICD-10-CM | POA: Diagnosis not present

## 2024-08-06 DIAGNOSIS — M2012 Hallux valgus (acquired), left foot: Secondary | ICD-10-CM | POA: Diagnosis not present

## 2024-08-06 DIAGNOSIS — B351 Tinea unguium: Secondary | ICD-10-CM | POA: Diagnosis not present

## 2024-08-06 DIAGNOSIS — M2041 Other hammer toe(s) (acquired), right foot: Secondary | ICD-10-CM | POA: Diagnosis not present

## 2024-08-19 ENCOUNTER — Ambulatory Visit (INDEPENDENT_AMBULATORY_CARE_PROVIDER_SITE_OTHER): Admitting: Family Medicine

## 2024-08-19 ENCOUNTER — Encounter: Payer: Self-pay | Admitting: Family Medicine

## 2024-08-19 VITALS — BP 132/80 | HR 70 | Ht 71.0 in | Wt 147.0 lb

## 2024-08-19 DIAGNOSIS — Z23 Encounter for immunization: Secondary | ICD-10-CM

## 2024-08-19 DIAGNOSIS — N529 Male erectile dysfunction, unspecified: Secondary | ICD-10-CM

## 2024-08-19 DIAGNOSIS — G8929 Other chronic pain: Secondary | ICD-10-CM

## 2024-08-19 DIAGNOSIS — M545 Low back pain, unspecified: Secondary | ICD-10-CM

## 2024-08-19 MED ORDER — NAPROXEN 500 MG PO TABS: 500.0000 mg | ORAL_TABLET | Freq: Two times a day (BID) | ORAL | 1 refills | Status: DC

## 2024-08-19 MED ORDER — TADALAFIL 20 MG PO TABS
20.0000 mg | ORAL_TABLET | ORAL | 3 refills | Status: AC | PRN
Start: 2024-08-19 — End: ?

## 2024-08-19 MED ORDER — BACLOFEN 10 MG PO TABS
10.0000 mg | ORAL_TABLET | Freq: Three times a day (TID) | ORAL | 1 refills | Status: AC | PRN
Start: 2024-08-19 — End: ?

## 2024-08-19 NOTE — Patient Instructions (Addendum)
 Thank you for coming to the office today.  1. For your Back Pain - I think that this is due to Muscle Spasms or strain.   2. Start with anti-inflammatory Naprosyn (Naproxen) 500mg  twice daily (12 hrs apart, with food, breakfast and dinner) every day for next 2 to 4 weeks if helping, then can use only as needed 3. Start Baclofen  (Lioresal ) 10mg  tablets - cut in half for 5mg  at night for muscle relaxant - may make you sedated or sleepy (be careful driving or working on this) if tolerated you can take every 8 hours, half or whole tab 4. May use Tylenol Extra Str 500mg  tabs - may take 1-2 tablets every 6 hours as needed 5. Recommend to start using heating pad on your lower back 1-2x daily for few weeks  This pain may take weeks to months to fully resolve, but hopefully it will respond to the medicine initially. All back injuries (small or serious) are slow to heal since we use our back muscles every day. Be careful with turning, twisting, lifting, sitting / standing for prolonged periods, and avoid re-injury.  If your symptoms significantly worsen with more pain, or new symptoms with weakness in one or both legs, new or different shooting leg pains, numbness in legs or groin, loss of control or retention of urine or bowel movements, please call back for advice and you may need to go directly to the Emergency Department.   You can try OTC cream for the post surgical START anti inflammatory topical - OTC Voltaren (generic Diclofenac) topical 2-4 times a day as needed for pain swelling of affected joint for 1-2 weeks or longer.   Please schedule a Follow-up Appointment to: Return if symptoms worsen or fail to improve.  If you have any other questions or concerns, please feel free to call the office or send a message through MyChart. You may also schedule an earlier appointment if necessary.  Additionally, you may be receiving a survey about your experience at our office within a few days to 1 week by  e-mail or mail. We value your feedback.  Marsa Officer, DO [facility], [place]

## 2024-08-19 NOTE — Progress Notes (Signed)
 Subjective:    Patient ID: James [name], male    DOB: Aug 16, 1941, 83 y.o.   MRN: [id number]  James [name] is a 83 y.o. male presenting on 08/19/2024 for Back Pain  Patient presents for a same day appointment.   HPI  Discussed the use of AI scribe software for clinical note transcription with the patient, who gave verbal consent to proceed.  History of Present Illness   James [name] Cheryl is an 83 year old male with arthritis who presents with back pain.  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10 - Previous x-rays performed for arthritis evaluation  Arthralgia and arthritis management - History of arthritis involving the back - Current pain management with Aleve, typically two tablets in the morning - Previous trial of meloxicam  without relief - Gabapentin  prescribed approximately 1.5 years ago, uncertain if still taking  Post-hernia repair pain - History of hernia operation with mesh placement - Occasional pain in the area of hernia repair - No bulging at the site of repair - Concern about pain in the area of hernia repair  Erectile dysfunction - Interest in medications for erectile dysfunction - Previously used Cialis , found it expensive but maybe did not use coupon discount      Health Maintenance: Flu Shot     08/19/2024    6:38 PM 07/30/2023   11:56 AM 07/22/2023   11:55 AM  Depression screen PHQ 2/9  Decreased Interest 0 0 0  Down, Depressed, Hopeless 0 0 0  PHQ - 2 Score 0 0 0   Altered sleeping  0 0  Tired, decreased energy  0 0  Change in appetite  0 0  Feeling bad or failure about yourself   0 0  Trouble concentrating  0 0  Moving slowly or fidgety/restless  0 0  Suicidal thoughts  0 0  PHQ-9 Score  0 0  Difficult doing work/chores  Not difficult at all Not difficult at all       07/30/2023   11:56 AM 07/22/2023   11:55 AM 06/22/2022   10:39 AM  GAD 7 : Generalized Anxiety Score  Nervous, Anxious, on Edge 0 0 0  Control/stop worrying 0 0 0  Worry too much - different things 0 0 0  Trouble relaxing 0 0 0  Restless 0 0 0  Easily annoyed or irritable 0 0 0  Afraid - awful might happen 0 0 0  Total GAD 7 Score 0 0 0  Anxiety Difficulty  Not difficult at all Not difficult at all    Social History   Tobacco Use   Smoking status: Every Day    Current packs/day: 0.25    Average packs/day: 0.3 packs/day for 50.0 years (12.5 ttl pk-yrs)    Types: Cigarettes   Smokeless tobacco: Never  Vaping Use   Vaping status: Never Used  Substance Use Topics   Alcohol use: Yes    Alcohol/week: 3.0 standard drinks of alcohol    Types: 3 Cans of beer  per week   Drug use: No    Review of Systems Per HPI unless specifically indicated above     Objective:    BP 132/80 (BP Location: Left Arm, Patient Position: Sitting, Cuff Size: Normal)   Pulse 70   Ht 5' 11 (1.803 m)   Wt 147 lb (66.7 kg)   SpO2 96%   BMI 20.50 kg/m   Wt Readings from Last 3 Encounters:  08/19/24 147 lb (66.7 kg)  04/08/24 147 lb 4 oz (66.8 kg)  10/04/23 147 lb 6.4 oz (66.9 kg)    Physical Exam Vitals and nursing note reviewed.  Constitutional:      General: He is not in acute distress.    Appearance: Normal appearance. He is well-developed. He is not diaphoretic.     Comments: Well-appearing, comfortable, cooperative  HENT:     Head: Normocephalic and atraumatic.  Eyes:     General:        Right eye: No discharge.        Left eye: No discharge.     Conjunctiva/sclera:  Conjunctivae normal.  Cardiovascular:     Rate and Rhythm: Normal rate.  Pulmonary:     Effort: Pulmonary effort is normal.  Musculoskeletal:     Comments: Low Back Inspection: Normal appearance, thin body habitus, no spinal deformity, symmetrical. Palpation: No tenderness over spinous processes. Left low back pain with muscle spasm hypertonicity lower sacroiliac region is area of concern. ROM: reduced active ROM forward flex / back extension. Some Left rotation discomfort Special Testing: Seated SLR negative for radicular pain bilaterally  Strength: Bilateral hip flex/ext 5/5, knee flex/ext 5/5, ankle dorsiflex/plantarflex 5/5 Neurovascular: intact distal sensation to light touch   Skin:    General: Skin is warm and dry.     Findings: No erythema or rash.  Neurological:     Mental Status: He is alert and oriented to person, place, and time.  Psychiatric:        Mood and Affect: Mood normal.        Behavior: Behavior normal.        Thought Content: Thought content normal.     Comments: Well groomed, good eye contact, normal speech and thoughts     I have personally reviewed the radiology report from 12/12/22 on Lumbar X-ray.  CLINICAL DATA:  chronic low back pain without sciatica   EXAM: LUMBAR SPINE - COMPLETE 4+ VIEW   COMPARISON:  MRI lumbar spine 01/11/2017   FINDINGS: Limited evaluation due to overlapping osseous structures and overlying soft tissues. Five non-rib-bearing lumbar vertebral bodies. No definite acute displaced fracture. Multilevel severe degenerative changes spine with multilevel chronic vertebral body height loss. Patient is rotated on frontal view with query some curvature of the lumbar spine. Multilevel intervertebral disc space narrowing-severe.   Atherosclerotic plaque.   IMPRESSION: 1. Multilevel severe degenerative changes spine with multilevel chronic vertebral body height loss. No definite acute displaced fracture. Limited evaluation due to  overlapping osseous structures and overlying soft tissues. Patient is rotated on frontal view. Recommend cross-sectional imaging for further evaluation. 2.  Aortic Atherosclerosis (ICD10-I70.0).     Electronically Signed   By: Morgane  Naveau M.D.   On: 12/14/2022 22:52  Results for orders placed or performed in visit on 07/25/23  TSH   Collection Time: 07/25/23  8:38 AM  Result Value Ref Range   TSH 4.28 0.40 - 4.50 mIU/L  CBC with Differential/Platelet   Collection Time: 07/25/23  8:38 AM  Result Value Ref  Range   WBC 6.9 3.8 - 10.8 Thousand/uL   RBC 5.08 4.20 - 5.80 Million/uL   Hemoglobin 15.3 13.2 - 17.1 g/dL   HCT 52.6 61.4 - 49.9 %   MCV 93.1 80.0 - 100.0 fL   MCH 30.1 27.0 - 33.0 pg   MCHC 32.3 32.0 - 36.0 g/dL   RDW 87.2 88.9 - 84.9 %   Platelets 302 140 - 400 Thousand/uL   MPV 11.4 7.5 - 12.5 fL   Neutro Abs 4,175 1,500 - 7,800 cells/uL   Lymphs Abs 1,718 850 - 3,900 cells/uL   Absolute Monocytes 455 200 - 950 cells/uL   Eosinophils Absolute 511 (H) 15 - 500 cells/uL   Basophils Absolute 41 0 - 200 cells/uL   Neutrophils Relative % 60.5 %   Total Lymphocyte 24.9 %   Monocytes Relative 6.6 %   Eosinophils Relative 7.4 %   Basophils Relative 0.6 %  COMPLETE METABOLIC PANEL WITH GFR   Collection Time: 07/25/23  8:38 AM  Result Value Ref Range   Glucose, Bld 82 65 - 99 mg/dL   BUN 19 7 - 25 mg/dL   Creat 8.91 9.29 - 8.77 mg/dL   eGFR 69 > OR = 60 fO/fpw/8.26f7   BUN/Creatinine Ratio SEE NOTE: 6 - 22 (calc)   Sodium 141 135 - 146 mmol/L   Potassium 4.4 3.5 - 5.3 mmol/L   Chloride 108 98 - 110 mmol/L   CO2 26 20 - 32 mmol/L   Calcium 9.1 8.6 - 10.3 mg/dL   Total Protein 6.2 6.1 - 8.1 g/dL   Albumin 4.0 3.6 - 5.1 g/dL   Globulin 2.2 1.9 - 3.7 g/dL (calc)   AG Ratio 1.8 1.0 - 2.5 (calc)   Total Bilirubin 0.6 0.2 - 1.2 mg/dL   Alkaline phosphatase (APISO) 88 35 - 144 U/L   AST 22 10 - 35 U/L   ALT 19 9 - 46 U/L  Hemoglobin A1c   Collection Time: 07/25/23   8:38 AM  Result Value Ref Range   Hgb A1c MFr Bld 5.6 <5.7 % of total Hgb   Mean Plasma Glucose 114 mg/dL   eAG (mmol/L) 6.3 mmol/L      Assessment & Plan:   Problem List Items Addressed This Visit     Erectile dysfunction   Relevant Medications   tadalafil  (CIALIS ) 20 MG tablet   Low back pain - Primary   Relevant Medications   naproxen (NAPROSYN) 500 MG tablet   baclofen  (LIORESAL ) 10 MG tablet   Other Visit Diagnoses       Flu vaccine need       Relevant Orders   Flu vaccine HIGH DOSE PF(Fluzone Trivalent) (Completed)        Acute on chronic Low back and left sacroiliac pain due to osteoarthritis Chronic pain exacerbated by activity, likely due to osteoarthritis. Seems to be sacroiliac region and muscular area however not provoked on soft tissue exam. Previous imaging confirmed advanced osteoarthritis. Current management with Aleve insufficient. Meloxicam  and gabapentin  ineffective.  - Discontinue Aleve OTC and Meloxicam  - Prescribe naproxen 500 mg twice daily with food for 1-2 weeks, then pause to avoid complications. Use PRN - Prescribe Baclofen  muscle relaxant up to three times daily as needed, recommend twice daily use. - Keep Tylenol up to 500-1000mg  THREE TIMES A DAY   Chronic postoperative pain after inguinal hernia repair Intermittent pain possibly due to nerve damage from surgery. No bulging or complications. - Recommend over-the-counter pain relief cream with  numbing effect for local application.  Erectile dysfunction Re order PDE5 - Prescribe Cialis  with discount coupon, 30 pills for $30, to be taken every 48 hours as needed. - Provide printed prescription and coupon for pharmacy submission.        Orders Placed This Encounter  Procedures   Flu vaccine HIGH DOSE PF(Fluzone Trivalent)    Meds ordered this encounter  Medications   naproxen (NAPROSYN) 500 MG tablet    Sig: Take 1 tablet (500 mg total) by mouth 2 (two) times daily with a meal. For 1-2  weeks then as needed    Dispense:  60 tablet    Refill:  1   baclofen  (LIORESAL ) 10 MG tablet    Sig: Take 1 tablet (10 mg total) by mouth 3 (three) times daily as needed for muscle spasms.    Dispense:  30 each    Refill:  1   tadalafil  (CIALIS ) 20 MG tablet    Sig: Take 1 tablet (20 mg total) by mouth every other day as needed for erectile dysfunction.    Dispense:  30 tablet    Refill:  3    He will use goodrx coupon    Follow up plan: Return if symptoms worsen or fail to improve.   Marsa Officer, DO [facility] 08/19/2024, 2:14 PM

## 2024-08-28 ENCOUNTER — Other Ambulatory Visit: Payer: Self-pay | Admitting: Family Medicine

## 2024-08-28 DIAGNOSIS — K219 Gastro-esophageal reflux disease without esophagitis: Secondary | ICD-10-CM

## 2024-08-29 NOTE — Telephone Encounter (Signed)
 Requested Prescriptions  Pending Prescriptions Disp Refills   omeprazole  (PRILOSEC) 40 MG capsule [Pharmacy Med Name: OMEPRAZOLE  DR 40 MG CAPSULE] 90 capsule 0    Sig: TAKE 1 CAPSULE (40 MG TOTAL) BY MOUTH DAILY.     Gastroenterology: Proton Pump Inhibitors Passed - 08/29/2024  9:07 AM      Passed - Valid encounter within last 12 months    Recent Outpatient Visits           1 week ago Acute left-sided low back pain without sciatica   [facility] [facility] [place], Marsa PARAS, DO   4 months ago Chronic bilateral low back pain without sciatica   [facility] [place], Marsa PARAS, [place]

## 2024-09-08 DIAGNOSIS — C44329 Squamous cell carcinoma of skin of other parts of face: Secondary | ICD-10-CM | POA: Diagnosis not present

## 2024-09-08 DIAGNOSIS — L905 Scar conditions and fibrosis of skin: Secondary | ICD-10-CM | POA: Diagnosis not present

## 2024-09-15 DIAGNOSIS — D0461 Carcinoma in situ of skin of right upper limb, including shoulder: Secondary | ICD-10-CM | POA: Diagnosis not present

## 2024-09-15 DIAGNOSIS — D0462 Carcinoma in situ of skin of left upper limb, including shoulder: Secondary | ICD-10-CM | POA: Diagnosis not present

## 2024-09-28 DIAGNOSIS — H5203 Hypermetropia, bilateral: Secondary | ICD-10-CM | POA: Diagnosis not present

## 2024-09-28 DIAGNOSIS — H52223 Regular astigmatism, bilateral: Secondary | ICD-10-CM | POA: Diagnosis not present

## 2024-09-28 DIAGNOSIS — H524 Presbyopia: Secondary | ICD-10-CM | POA: Diagnosis not present

## 2024-09-28 DIAGNOSIS — H43813 Vitreous degeneration, bilateral: Secondary | ICD-10-CM | POA: Diagnosis not present

## 2024-09-28 DIAGNOSIS — H2513 Age-related nuclear cataract, bilateral: Secondary | ICD-10-CM | POA: Diagnosis not present

## 2024-10-13 ENCOUNTER — Other Ambulatory Visit: Payer: Self-pay | Admitting: Family Medicine

## 2024-10-13 DIAGNOSIS — G8929 Other chronic pain: Secondary | ICD-10-CM

## 2024-10-13 DIAGNOSIS — M545 Low back pain, unspecified: Secondary | ICD-10-CM

## 2024-10-13 DIAGNOSIS — J432 Centrilobular emphysema: Secondary | ICD-10-CM

## 2024-10-15 NOTE — Telephone Encounter (Signed)
 Called patient to schedule annual physical. Scheduled for 11/24/24. Patient reports he is not taking naproxen  due to side effects of dizziness after taking. Will review with PCP at appt. No sx reported now.

## 2024-10-15 NOTE — Telephone Encounter (Signed)
 Requested medication (s) are due for refill today: na   Requested medication (s) are on the active medication list: yes   Last refill:  08/19/24 #60 1 refills  Future visit scheduled: yes 11/24/24  Notes to clinic:  manual review. Called patient to schedule annual physical and patient reports he is not taking medication due to side effects of dizziness. Did not report dizziness now. Do you want to continue refills?      Requested Prescriptions  Pending Prescriptions Disp Refills   naproxen  (NAPROSYN ) 500 MG tablet [Pharmacy Med Name: NAPROXEN  500 MG TABLET] 60 tablet 1    Sig: TAKE 1 TABLET (500 MG TOTAL) BY MOUTH 2 (TWO) TIMES DAILY WITH A MEAL. FOR 1-2 WEEKS THEN AS NEEDED     Analgesics:  NSAIDS Failed - 10/15/2024  3:14 PM      Failed - Manual Review: Labs are only required if the patient has taken medication for more than 8 weeks.      Failed - Cr in normal range and within 360 days    Creat  Date Value Ref Range Status  07/25/2023 1.08 0.70 - 1.22 mg/dL Final         Failed - HGB in normal range and within 360 days    Hemoglobin  Date Value Ref Range Status  07/25/2023 15.3 13.2 - 17.1 g/dL Final         Failed - PLT in normal range and within 360 days    Platelets  Date Value Ref Range Status  07/25/2023 302 140 - 400 Thousand/uL Final         Failed - HCT in normal range and within 360 days    HCT  Date Value Ref Range Status  07/25/2023 47.3 38.5 - 50.0 % Final         Failed - eGFR is 30 or above and within 360 days    GFR calc Af Amer  Date Value Ref Range Status  12/12/2018 >60 >60 mL/min Final   GFR calc non Af Amer  Date Value Ref Range Status  12/12/2018 >60 >60 mL/min Final   eGFR  Date Value Ref Range Status  07/25/2023 69 > OR = 60 mL/min/1.56m2 Final         Passed - Patient is not pregnant      Passed - Valid encounter within last 12 months    Recent Outpatient Visits           1 month ago Acute left-sided low back pain without  sciatica   [place] [facility] [place] Marsa PARAS, DO   6 months ago Chronic bilateral low back pain without sciatica   [place] [place], Marsa PARAS, DO              Signed Prescriptions Disp Refills   BREO ELLIPTA  100-25 MCG/ACT AEPB 60 each 1    Sig: INHALE 1 PUFF BY MOUTH EVERY DAY     Pulmonology:  Combination Products Passed - 10/15/2024  3:14 PM      Passed - Valid encounter within last 12 months    Recent Outpatient Visits           1 month ago Acute left-sided low back pain without sciatica   [place] [facility] [place], Marsa PARAS, DO   6 months ago Chronic bilateral low back pain without sciatica    [facility] [place], Marsa PARAS, [place]

## 2024-10-15 NOTE — Telephone Encounter (Signed)
 Requested by interface surescrips. Future visit 11/24/24. Requested Prescriptions  Pending Prescriptions Disp Refills   naproxen  (NAPROSYN ) 500 MG tablet [Pharmacy Med Name: NAPROXEN  500 MG TABLET] 60 tablet 1    Sig: TAKE 1 TABLET (500 MG TOTAL) BY MOUTH 2 (TWO) TIMES DAILY WITH A MEAL. FOR 1-2 WEEKS THEN AS NEEDED     Analgesics:  NSAIDS Failed - 10/15/2024  3:14 PM      Failed - Manual Review: Labs are only required if the patient has taken medication for more than 8 weeks.      Failed - Cr in normal range and within 360 days    Creat  Date Value Ref Range Status  07/25/2023 1.08 0.70 - 1.22 mg/dL Final         Failed - HGB in normal range and within 360 days    Hemoglobin  Date Value Ref Range Status  07/25/2023 15.3 13.2 - 17.1 g/dL Final         Failed - PLT in normal range and within 360 days    Platelets  Date Value Ref Range Status  07/25/2023 302 140 - 400 Thousand/uL Final         Failed - HCT in normal range and within 360 days    HCT  Date Value Ref Range Status  07/25/2023 47.3 38.5 - 50.0 % Final         Failed - eGFR is 30 or above and within 360 days    GFR calc Af Amer  Date Value Ref Range Status  12/12/2018 >60 >60 mL/min Final   GFR calc non Af Amer  Date Value Ref Range Status  12/12/2018 >60 >60 mL/min Final   eGFR  Date Value Ref Range Status  07/25/2023 69 > OR = 60 mL/min/1.66m2 Final         Passed - Patient is not pregnant      Passed - Valid encounter within last 12 months    Recent Outpatient Visits           1 month ago Acute left-sided low back pain without sciatica   [place] [facility] [place] Marsa PARAS, DO   6 months ago Chronic bilateral low back pain without sciatica   [place] [facility] [place], Marsa PARAS, DO               BREO ELLIPTA  100-25 MCG/ACT AEPB [Pharmacy Med Name: BREO ELLIPTA  100-25 MCG INHALR] 60 each 1    Sig: INHALE 1 PUFF BY MOUTH EVERY DAY      Pulmonology:  Combination Products Passed - 10/15/2024  3:14 PM      Passed - Valid encounter within last 12 months    Recent Outpatient Visits           1 month ago Acute left-sided low back pain without sciatica   [place] [facility] [place], Marsa PARAS, DO   6 months ago Chronic bilateral low back pain without sciatica   [place] [facility] [place], Marsa PARAS, [place]

## 2024-11-21 ENCOUNTER — Other Ambulatory Visit: Payer: Self-pay | Admitting: Family Medicine

## 2024-11-21 DIAGNOSIS — K219 Gastro-esophageal reflux disease without esophagitis: Secondary | ICD-10-CM

## 2024-11-23 ENCOUNTER — Other Ambulatory Visit: Payer: Self-pay | Admitting: Family Medicine

## 2024-11-23 DIAGNOSIS — N401 Enlarged prostate with lower urinary tract symptoms: Secondary | ICD-10-CM

## 2024-11-23 NOTE — Telephone Encounter (Signed)
 Requested Prescriptions  Pending Prescriptions Disp Refills   omeprazole  (PRILOSEC) 40 MG capsule [Pharmacy Med Name: OMEPRAZOLE  DR 40 MG CAPSULE] 90 capsule 0    Sig: TAKE 1 CAPSULE (40 MG TOTAL) BY MOUTH DAILY.     Gastroenterology: Proton Pump Inhibitors Passed - 11/23/2024 11:42 AM      Passed - Valid encounter within last 12 months    Recent Outpatient Visits           3 months ago Acute left-sided low back pain without sciatica   [place] [facility] [place], Marsa PARAS, DO   7 months ago Chronic bilateral low back pain without sciatica   [facility] [place], Marsa PARAS, [place]

## 2024-11-24 ENCOUNTER — Ambulatory Visit: Admitting: Family Medicine

## 2024-11-24 ENCOUNTER — Other Ambulatory Visit: Payer: Self-pay

## 2024-11-24 ENCOUNTER — Encounter: Payer: Self-pay | Admitting: Family Medicine

## 2024-11-24 VITALS — BP 132/80 | HR 87 | Ht 71.0 in | Wt 145.2 lb

## 2024-11-24 DIAGNOSIS — R3914 Feeling of incomplete bladder emptying: Secondary | ICD-10-CM | POA: Diagnosis not present

## 2024-11-24 DIAGNOSIS — J432 Centrilobular emphysema: Secondary | ICD-10-CM

## 2024-11-24 DIAGNOSIS — R7309 Other abnormal glucose: Secondary | ICD-10-CM | POA: Diagnosis not present

## 2024-11-24 DIAGNOSIS — K219 Gastro-esophageal reflux disease without esophagitis: Secondary | ICD-10-CM | POA: Diagnosis not present

## 2024-11-24 DIAGNOSIS — I10 Essential (primary) hypertension: Secondary | ICD-10-CM

## 2024-11-24 DIAGNOSIS — E7800 Pure hypercholesterolemia, unspecified: Secondary | ICD-10-CM | POA: Diagnosis not present

## 2024-11-24 DIAGNOSIS — N401 Enlarged prostate with lower urinary tract symptoms: Secondary | ICD-10-CM

## 2024-11-24 DIAGNOSIS — Z0000 Encounter for general adult medical examination without abnormal findings: Secondary | ICD-10-CM | POA: Diagnosis not present

## 2024-11-24 DIAGNOSIS — G3184 Mild cognitive impairment, so stated: Secondary | ICD-10-CM | POA: Insufficient documentation

## 2024-11-24 DIAGNOSIS — Z720 Tobacco use: Secondary | ICD-10-CM | POA: Diagnosis not present

## 2024-11-24 MED ORDER — AMLODIPINE BESYLATE 5 MG PO TABS: 5.0000 mg | ORAL_TABLET | Freq: Every day | ORAL | 3 refills | Status: AC

## 2024-11-24 MED ORDER — OMEPRAZOLE 40 MG PO CPDR: 40.0000 mg | DELAYED_RELEASE_CAPSULE | Freq: Every day | ORAL | 3 refills | Status: AC

## 2024-11-24 NOTE — Patient Instructions (Addendum)
 Thank you for coming to the office today.  Recommend Shingles Shingrix vaccine at the pharmacy only 2 doses 2-6 months apart.  Labs are on Thursday 1/29 at 930am.  Refills are all set - sent to pharmacy.  For ED Take Sildenafil 20mg  - take 1 tablet about 30 min prior to sexual intercourse for improved erection. If this dose does not work or is not strong enough NEXT TIME you can increase to 2 pills for 40mg . Maximum dose is 5 pills or 100mg , most people end up taking 3-4 pills per dose and this decision is up to you based on the results.  Once you take a dose, you have to wait 24 hours to repeat a dose.  DUE for FASTING BLOOD WORK (no food or drink after midnight before the lab appointment, only water or coffee without cream/sugar on the morning of)  Thursday 1/29 at 930am  Please schedule a Follow-up Appointment to: Return in about 6 months (around 05/24/2025) for 6 month follow-up HTN, COPD, Memory follow-up.  If you have any other questions or concerns, please feel free to call the office or send a message through MyChart. You may also schedule an earlier appointment if necessary.  Additionally, you may be receiving a survey about your experience at our office within a few days to 1 week by e-mail or mail. We value your feedback.  Marsa Officer, DO [facility], [place]

## 2024-11-24 NOTE — Progress Notes (Signed)
 "  Subjective:    Patient ID: James [name], male    DOB: 1941/06/22, 84 y.o.   MRN: [id number]  James [name] is a 84 y.o. male presenting on 11/24/2024 for Annual Exam   HPI  Discussed the use of AI scribe software for clinical note transcription with the patient, who gave verbal consent to proceed.  History of Present Illness   James [name] is an 84 year old male who presents for an annual physical exam.   Immunization status - Up to date on pneumonia and influenza vaccinations. - Has not had shingles and is considering the shingles vaccine.   CHRONIC HTN: Reports BP controlled on current therapy Current Meds - Amlodipine  5mg  daily, Doxazosin  4mg  daily   Reports good compliance, took meds today. Tolerating well, w/o complaints. 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   Erectile dysfunction Previously Sildenafil vs Tadalafil  Has refills on Tadalafil  AS NEEDED  BPH with LUTS On Doxazosin  4mg  daily with improved urinary symptoms  Centrilobular Emphysema - Uses Breo for breathing issues. - Has a rescue inhaler available. - Cost of Breo has increased recently.  Mild Cognitive Impairment Memory vs History of Dementia Currently improved vs stable with chronic memory loss.  Chronic GERD On Omeprazole  40mg  daily  Tobacco  Abuse Active smoker, not yet ready to quit  Health Maintenance:  Up to date on Prevnar-20 and Flu vaccine  Due Shingles vaccine. Recommend pharmacy     08/19/2024    6:38 PM 07/30/2023   11:56 AM 07/22/2023   11:55 AM  Depression screen PHQ 2/9  Decreased Interest 0 0 0  Down, Depressed, Hopeless 0 0 0  PHQ - 2 Score 0 0 0  Altered sleeping  0 0  Tired, decreased energy  0 0  Change in appetite  0 0  Feeling bad or failure about yourself   0 0  Trouble concentrating  0 0  Moving slowly or fidgety/restless  0 0  Suicidal thoughts  0 0  PHQ-9 Score  0  0   Difficult doing work/chores  Not difficult at all Not difficult at all     Data saved with a previous flowsheet row definition       07/30/2023   11:56 AM 07/22/2023   11:55 AM 06/22/2022   10:39 AM  GAD 7 : Generalized Anxiety Score  Nervous, Anxious, on Edge 0  0  0   Control/stop worrying 0  0  0   Worry too much - different things 0  0  0   Trouble relaxing 0  0  0   Restless 0  0  0   Easily annoyed or irritable 0  0  0   Afraid - awful might happen 0  0  0   Total GAD 7 Score 0 0 0  Anxiety Difficulty  Not difficult at all Not difficult at all     Data saved with a previous flowsheet row definition     Past Medical History:  Diagnosis Date   Arthritis    Asthma    Baker's cyst of knee, right    BPH (benign prostatic hyperplasia)    Bronchitis    Carotid stenosis, left    Carpal tunnel syndrome, right    COPD (chronic obstructive pulmonary disease) (HCC)    Dysphagia    Elevated cholesterol    Erectile dysfunction    Gastritis and duodenitis    GERD (gastroesophageal reflux disease)    Neuropathy of right forearm    Schatzki's ring    Tendinitis    Vertigo, intermittent    Weight loss    Past Surgical History:  Procedure Laterality Date   BALLOON DILATION N/A 12/17/2018   Procedure: BALLOON DILATION;  Surgeon: Viktoria Lamar DASEN, MD;  Location: [facility] ENDOSCOPY;  Service: Endoscopy;  Laterality:  N/A;   COLONOSCOPY WITH ESOPHAGOGASTRODUODENOSCOPY (EGD) AND ESOPHAGEAL DILATION (ED)     ESOPHAGOGASTRODUODENOSCOPY (EGD) WITH PROPOFOL  N/A 10/26/2016   Procedure: ESOPHAGOGASTRODUODENOSCOPY (EGD) WITH PROPOFOL ;  Surgeon: Lamar DASEN Viktoria, MD;  Location: [facility] ENDOSCOPY;  Service: Endoscopy;  Laterality: N/A;   ESOPHAGOGASTRODUODENOSCOPY (EGD) WITH PROPOFOL  N/A 12/17/2018   Procedure: ESOPHAGOGASTRODUODENOSCOPY (EGD) WITH PROPOFOL ;  Surgeon: Viktoria Lamar DASEN, MD;  Location: [facility] ENDOSCOPY;  Service: Endoscopy;  Laterality: N/A;   ESOPHAGOGASTRODUODENOSCOPY (EGD) WITH PROPOFOL  N/A 10/04/2023   Procedure: ESOPHAGOGASTRODUODENOSCOPY (EGD) WITH PROPOFOL ;  Surgeon: Maryruth Ole DASEN, MD;  Location: [facility] ENDOSCOPY;  Service: Endoscopy;  Laterality: N/A;   ESOPHAGOGASTRODUODENOSCOPY (EGD) WITH PROPOFOL  N/A 06/28/2023   Procedure: ESOPHAGOGASTRODUODENOSCOPY (EGD) WITH PROPOFOL ;  Surgeon: Maryruth Ole DASEN, MD;  Location: [facility] ENDOSCOPY;  Service: Endoscopy;  Laterality: N/A;   HERNIA REPAIR     SAVORY DILATION N/A 10/26/2016   Procedure: SAVORY DILATION;  Surgeon: Lamar DASEN Viktoria, MD;  Location: [facility] ENDOSCOPY;  Service: Endoscopy;  Laterality: N/A;   Social History   Socioeconomic History   Marital status: Widowed    Spouse name: Not on file   Number of children: Not on file   Years of education: Not on file   Highest education level: Not on file  Occupational History   Not on file  Tobacco Use   Smoking status: Every Day    Current packs/day: 0.25    Average packs/day: 0.3 packs/day for 50.0 years (12.5 ttl pk-yrs)    Types: Cigarettes   Smokeless tobacco: Never  Vaping Use   Vaping status: Never Used  Substance and Sexual Activity   Alcohol use: Yes    Alcohol/week: 3.0 standard drinks of alcohol    Types: 3 Cans of beer per week   Drug use: No   Sexual activity: Not on file  Other Topics Concern   Not on file  Social History Narrative   Not on file   Social Drivers of Health    Tobacco Use: High Risk (11/24/2024)   Patient History    Smoking Tobacco Use: Every Day    Smokeless Tobacco Use: Never    Passive Exposure: Not on file  Financial Resource Strain: Low Risk  (08/06/2024)   Received from [facility]   Overall Financial Resource Strain (CARDIA)    Difficulty of Paying Living Expenses: Not very hard  Food Insecurity: No Food Insecurity (08/06/2024)   Received from [facility]   Epic    Within the past 12 months, you worried  that your food would run out before you got the money to buy more.: Never true    Within the past 12 months, the food you bought just didn't last and you didn't have money to get more.: Never true  Transportation Needs: No Transportation Needs (08/06/2024)   Received from [facility] - Transportation    In the past 12 months, has lack of transportation kept you from medical appointments or from getting medications?: No    Lack of Transportation (Non-Medical): No  Physical Activity: Insufficiently Active (01/04/2023)   Exercise Vital Sign    Days of Exercise per Week: 7 days    Minutes of Exercise per Session: 20 min  Stress: No Stress Concern Present (01/04/2023)   [employer] of [employer]ational [employer] - Occupational Stress Questionnaire    Feeling of Stress : Not at all  Social Connections: Moderately Isolated (01/04/2023)   Social Connection and Isolation Panel    Frequency of Communication with Friends and Family: More than three times a week    Frequency of Social Gatherings with Friends and Family: More than three times a week    Attends Religious Services: 1 to 4 times per year    Active Member of [employer] or Organizations: No    Attends Banker Meetings: Never    Marital Status: Widowed  Intimate Partner Violence: Not At Risk (01/04/2023)   Humiliation, Afraid, Rape, and Kick questionnaire    Fear of Current or Ex-Partner: No    Emotionally Abused: No     Physically Abused: No    Sexually Abused: No  Depression (PHQ2-9): Low Risk (08/19/2024)   Depression (PHQ2-9)    PHQ-2 Score: 0  Alcohol Screen: Low Risk (07/30/2023)   Alcohol Screen    Last Alcohol Screening Score (AUDIT): 2  Housing: Low Risk  (08/06/2024)   Received from [facility]   Epic    In the last 12 months, was there a time when you were not able to pay the mortgage or rent on time?: No    In the past 12 months, how many times have you moved where you were living?: 0    At any time in the past 12 months, were you homeless or living in a shelter (including now)?: No  Utilities: Not At Risk (08/06/2024)   Received from [facility]   Epic    In the past 12 months has the electric, gas, oil, or water company threatened to shut off services in your home?: No  Health Literacy: Not on file   History reviewed. No pertinent family history. Current Outpatient Medications on File Prior to Visit  Medication Sig   albuterol  (VENTOLIN  HFA) 108 (90 Base) MCG/ACT inhaler Inhale 1-2 puffs into the lungs every 4 (four) hours as needed for wheezing or shortness of breath.   baclofen  (LIORESAL ) 10 MG tablet Take 1 tablet (10 mg total) by mouth 3 (three) times daily as needed for muscle spasms.   BREO ELLIPTA  100-25 MCG/ACT AEPB INHALE 1 PUFF BY MOUTH EVERY DAY   fluticasone  (FLONASE ) 50 MCG/ACT nasal spray Place 2 sprays into both nostrils daily. Use for 4-6 weeks then stop and use seasonally or as needed.   Multiple Vitamin (MULTIVITAMIN WITH MINERALS) TABS tablet Take 1 tablet by mouth daily.   naproxen  (NAPROSYN ) 500 MG tablet TAKE 1 TABLET (500 MG TOTAL) BY MOUTH 2 (TWO) TIMES DAILY WITH A MEAL. FOR 1-2 WEEKS THEN AS NEEDED  tadalafil  (CIALIS ) 20 MG tablet Take 1 tablet (20 mg total) by mouth every other day as needed for erectile dysfunction.   doxazosin  (CARDURA ) 4 MG tablet TAKE 1 TABLET BY MOUTH EVERY DAY   No current facility-administered  medications on file prior to visit.    Review of Systems  Constitutional:  Negative for activity change, appetite change, chills, diaphoresis, fatigue and fever.  HENT:  Negative for congestion and hearing loss.   Eyes:  Negative for visual disturbance.  Respiratory:  Negative for cough, chest tightness, shortness of breath and wheezing.   Cardiovascular:  Negative for chest pain, palpitations and leg swelling.  Gastrointestinal:  Negative for abdominal pain, constipation, diarrhea, nausea and vomiting.  Genitourinary:  Negative for dysuria, frequency and hematuria.  Musculoskeletal:  Negative for arthralgias and neck pain.  Skin:  Negative for rash.  Neurological:  Negative for dizziness, weakness, light-headedness, numbness and headaches.  Hematological:  Negative for adenopathy.  Psychiatric/Behavioral:  Negative for behavioral problems, dysphoric mood and sleep disturbance.    Per HPI unless specifically indicated above     Objective:    BP 132/80 (BP Location: Left Arm, Patient Position: Sitting, Cuff Size: Normal)   Pulse 87   Ht 5' 11 (1.803 m)   Wt 145 lb 4 oz (65.9 kg)   SpO2 96%   BMI 20.26 kg/m   Wt Readings from Last 3 Encounters:  11/24/24 145 lb 4 oz (65.9 kg)  08/19/24 147 lb (66.7 kg)  04/08/24 147 lb 4 oz (66.8 kg)    Physical Exam Vitals and nursing note reviewed.  Constitutional:      General: He is not in acute distress.    Appearance: He is well-developed. He is not diaphoretic.     Comments: Well-appearing, comfortable, cooperative  HENT:     Head: Normocephalic and atraumatic.  Eyes:     General:        Right eye: No discharge.        Left eye: No discharge.     Conjunctiva/sclera: Conjunctivae normal.     Pupils: Pupils are equal, round, and reactive to light.  Neck:     Thyroid : No thyromegaly.     Vascular: No carotid bruit.  Cardiovascular:     Rate and Rhythm: Normal rate and regular rhythm.     Pulses: Normal pulses.     Heart  sounds: Normal heart sounds. No murmur heard. Pulmonary:     Effort: Pulmonary effort is normal. No respiratory distress.     Breath sounds: Wheezing (mild end exp wheezing diffuse) present. No rales.  Abdominal:     General: Bowel sounds are normal. There is no distension.     Palpations: Abdomen is soft. There is no mass.     Tenderness: There is no abdominal tenderness.  Musculoskeletal:        General: No tenderness. Normal range of motion.     Cervical back: Normal range of motion and neck supple.     Right lower leg: No edema.     Left lower leg: No edema.     Comments: Upper / Lower Extremities: - Normal muscle tone, strength bilateral upper extremities 5/5, lower extremities 5/5  Lymphadenopathy:     Cervical: No cervical adenopathy.  Skin:    General: Skin is warm and dry.     Findings: No erythema or rash.  Neurological:     Mental Status: He is alert and oriented to person, place, and time.  Comments: Distal sensation intact to light touch all extremities  Psychiatric:        Mood and Affect: Mood normal.        Behavior: Behavior normal.        Thought Content: Thought content normal.     Comments: Well groomed, good eye contact, normal speech and thoughts        01/04/2023    3:01 PM  6CIT Screen  What Year? 0 points  What month? 0 points  What time? 0 points  Count back from 20 0 points  Months in reverse 4 points  Repeat phrase 4 points  Total Score 8 points      Results for orders placed or performed in visit on 07/25/23  TSH   Collection Time: 07/25/23  8:38 AM  Result Value Ref Range   TSH 4.28 0.40 - 4.50 mIU/L  CBC with Differential/Platelet   Collection Time: 07/25/23  8:38 AM  Result Value Ref Range   WBC 6.9 3.8 - 10.8 Thousand/uL   RBC 5.08 4.20 - 5.80 Million/uL   Hemoglobin 15.3 13.2 - 17.1 g/dL   HCT 52.6 61.4 - 49.9 %   MCV 93.1 80.0 - 100.0 fL   MCH 30.1 27.0 - 33.0 pg   MCHC 32.3 32.0 - 36.0 g/dL   RDW 87.2 88.9 - 84.9 %    Platelets 302 140 - 400 Thousand/uL   MPV 11.4 7.5 - 12.5 fL   Neutro Abs 4,175 1,500 - 7,800 cells/uL   Lymphs Abs 1,718 850 - 3,900 cells/uL   Absolute Monocytes 455 200 - 950 cells/uL   Eosinophils Absolute 511 (H) 15 - 500 cells/uL   Basophils Absolute 41 0 - 200 cells/uL   Neutrophils Relative % 60.5 %   Total Lymphocyte 24.9 %   Monocytes Relative 6.6 %   Eosinophils Relative 7.4 %   Basophils Relative 0.6 %  COMPLETE METABOLIC PANEL WITH GFR   Collection Time: 07/25/23  8:38 AM  Result Value Ref Range   Glucose, Bld 82 65 - 99 mg/dL   BUN 19 7 - 25 mg/dL   Creat 8.91 9.29 - 8.77 mg/dL   eGFR 69 > OR = 60 fO/fpw/8.26f7   BUN/Creatinine Ratio SEE NOTE: 6 - 22 (calc)   Sodium 141 135 - 146 mmol/L   Potassium 4.4 3.5 - 5.3 mmol/L   Chloride 108 98 - 110 mmol/L   CO2 26 20 - 32 mmol/L   Calcium 9.1 8.6 - 10.3 mg/dL   Total Protein 6.2 6.1 - 8.1 g/dL   Albumin 4.0 3.6 - 5.1 g/dL   Globulin 2.2 1.9 - 3.7 g/dL (calc)   AG Ratio 1.8 1.0 - 2.5 (calc)   Total Bilirubin 0.6 0.2 - 1.2 mg/dL   Alkaline phosphatase (APISO) 88 35 - 144 U/L   AST 22 10 - 35 U/L   ALT 19 9 - 46 U/L  Hemoglobin A1c   Collection Time: 07/25/23  8:38 AM  Result Value Ref Range   Hgb A1c MFr Bld 5.6 <5.7 % of total Hgb   Mean Plasma Glucose 114 mg/dL   eAG (mmol/L) 6.3 mmol/L      Assessment & Plan:   Problem List Items Addressed This Visit     Benign prostatic hyperplasia with incomplete bladder emptying   Relevant Orders   PSA   Centrilobular emphysema (HCC)   Relevant Orders   CBC with Differential/Platelet   Comprehensive metabolic panel with GFR   Current tobacco use  Essential hypertension   Relevant Medications   amLODipine  (NORVASC ) 5 MG tablet   Other Relevant Orders   CBC with Differential/Platelet   TSH   Comprehensive metabolic panel with GFR   Gastroesophageal reflux disease without esophagitis   Relevant Medications   omeprazole  (PRILOSEC) 40 MG capsule   MCI (mild  cognitive impairment) with memory loss   Pure hypercholesterolemia   Relevant Medications   amLODipine  (NORVASC ) 5 MG tablet   Other Relevant Orders   Lipid panel   TSH   Comprehensive metabolic panel with GFR   Other Visit Diagnoses       Annual physical exam    -  Primary   Relevant Orders   Lipid panel   Hemoglobin A1c   CBC with Differential/Platelet   PSA   TSH   Comprehensive metabolic panel with GFR     Abnormal glucose       Relevant Orders   Hemoglobin A1c        Updated Health Maintenance information Return for Fasting labs 1/29 Encouraged improvement to lifestyle with diet and exercise   Centrilobular emphysema Chronic condition with wheezing. Breo inhaler cost increased due to insurance changes. - Continue Breo inhaler as prescribed. - Monitor inhaler cost and adjust as needed. Discussed likely high cost at start of 2026 due to medicare changes with Rx benefits  Benign prostatic hyperplasia with incomplete bladder emptying Chronic condition managed with doxazosin . - Continue doxazosin  as prescribed. Refilled  Essential hypertension Blood pressure well-controlled at 132/80 mmHg. Managed with amlodipine  and doxazosin . - Continue amlodipine  and doxazosin  as prescribed. - Refilled amlodipine  prescription.  Gastroesophageal reflux disease Chronic condition managed with medication. Continue Omeprazole  40mg  daily  Mild cognitive impairment with memory loss vs Dementia history Chronic condition with no effective prescription options. Failed Donepezil in past. Emphasized maintaining nutrition and sleep. - Continue current management strategies.  Back pain due to osteoarthritis Chronic condition managed with naproxen . Advised against mixing with over-the-counter Aleve  due to previous dizziness. - Continue naproxen  as prescribed. - Avoid mixing naproxen  with over-the-counter Aleve .  General Health Maintenance Due for blood work and shingles vaccination. Up  to date on pneumonia and flu shots. - Scheduled blood work for Thursday at 9:30 AM. - Recommended shingles vaccination at pharmacy. - Continue routine health maintenance.         Orders Placed This Encounter  Procedures   Lipid panel    Standing Status:   Future    Expected Date:   11/26/2024    Expiration Date:   02/24/2025    Has the patient fasted?:   Yes   Hemoglobin A1c    Standing Status:   Future    Expected Date:   11/26/2024    Expiration Date:   02/24/2025   CBC with Differential/Platelet    Standing Status:   Future    Expected Date:   11/26/2024    Expiration Date:   02/24/2025   PSA    Standing Status:   Future    Expected Date:   11/26/2024    Expiration Date:   02/24/2025   TSH    Standing Status:   Future    Expected Date:   11/26/2024    Expiration Date:   02/24/2025   Comprehensive metabolic panel with GFR    Standing Status:   Future    Expected Date:   11/26/2024    Expiration Date:   02/24/2025    Has the patient fasted?:   Yes  Meds ordered this encounter  Medications   amLODipine  (NORVASC ) 5 MG tablet    Sig: Take 1 tablet (5 mg total) by mouth daily.    Dispense:  90 tablet    Refill:  3    Add refills   omeprazole  (PRILOSEC) 40 MG capsule    Sig: Take 1 capsule (40 mg total) by mouth daily.    Dispense:  90 capsule    Refill:  3    Add refill     Follow up plan: Return in about 6 months (around 05/24/2025) for 6 month follow-up HTN, COPD, Memory follow-up.  Marsa Officer, DO [facility] 11/24/2024, 1:36 PM  "

## 2024-11-24 NOTE — Telephone Encounter (Signed)
 Requested medication (s) are due for refill today - expired Rx  Requested medication (s) are on the active medication list -yes  Future visit scheduled -today  Last refill: 10/17/23 #90 3RF  Notes to clinic: Has appointment today- will let provider decide refill amount  Requested Prescriptions  Pending Prescriptions Disp Refills   doxazosin  (CARDURA ) 4 MG tablet [Pharmacy Med Name: DOXAZOSIN  MESYLATE 4 MG TAB] 90 tablet 3    Sig: TAKE 1 TABLET BY MOUTH EVERY DAY     Cardiovascular:  Alpha Blockers Passed - 11/24/2024 10:21 AM      Passed - Last BP in normal range    BP Readings from Last 1 Encounters:  08/19/24 132/80         Passed - Valid encounter within last 6 months    Recent Outpatient Visits           3 months ago Acute left-sided low back pain without sciatica   [place] [facility] [place], Marsa PARAS, DO   7 months ago Chronic bilateral low back pain without sciatica   [facility] [place] Marsa PARAS, DO                 Requested Prescriptions  Pending Prescriptions Disp Refills   doxazosin  (CARDURA ) 4 MG tablet [Pharmacy Med Name: DOXAZOSIN  MESYLATE 4 MG TAB] 90 tablet 3    Sig: TAKE 1 TABLET BY MOUTH EVERY DAY     Cardiovascular:  Alpha Blockers Passed - 11/24/2024 10:21 AM      Passed - Last BP in normal range    BP Readings from Last 1 Encounters:  08/19/24 132/80         Passed - Valid encounter within last 6 months    Recent Outpatient Visits           3 months ago Acute left-sided low back pain without sciatica    [facility] [place], Marsa PARAS, DO   7 months ago Chronic bilateral low back pain without sciatica   [facility] [place], Marsa PARAS, [place]

## 2024-11-26 ENCOUNTER — Other Ambulatory Visit

## 2024-11-26 DIAGNOSIS — E7800 Pure hypercholesterolemia, unspecified: Secondary | ICD-10-CM

## 2024-11-26 DIAGNOSIS — Z0000 Encounter for general adult medical examination without abnormal findings: Secondary | ICD-10-CM

## 2024-11-26 DIAGNOSIS — J432 Centrilobular emphysema: Secondary | ICD-10-CM

## 2024-11-26 DIAGNOSIS — R7309 Other abnormal glucose: Secondary | ICD-10-CM

## 2024-11-26 DIAGNOSIS — I10 Essential (primary) hypertension: Secondary | ICD-10-CM

## 2024-11-26 DIAGNOSIS — N401 Enlarged prostate with lower urinary tract symptoms: Secondary | ICD-10-CM

## 2024-11-27 ENCOUNTER — Ambulatory Visit: Payer: Self-pay | Admitting: Family Medicine

## 2024-11-27 LAB — COMPREHENSIVE METABOLIC PANEL WITH GFR
AG Ratio: 1.8 (calc) (ref 1.0–2.5)
ALT: 14 U/L (ref 9–46)
AST: 22 U/L (ref 10–35)
Albumin: 4.2 g/dL (ref 3.6–5.1)
Alkaline phosphatase (APISO): 79 U/L (ref 35–144)
BUN/Creatinine Ratio: 16 (calc) (ref 6–22)
BUN: 20 mg/dL (ref 7–25)
CO2: 28 mmol/L (ref 20–32)
Calcium: 9.4 mg/dL (ref 8.6–10.3)
Chloride: 108 mmol/L (ref 98–110)
Creat: 1.28 mg/dL — ABNORMAL HIGH (ref 0.70–1.22)
Globulin: 2.3 g/dL (ref 1.9–3.7)
Glucose, Bld: 96 mg/dL (ref 65–99)
Potassium: 4.9 mmol/L (ref 3.5–5.3)
Sodium: 141 mmol/L (ref 135–146)
Total Bilirubin: 0.8 mg/dL (ref 0.2–1.2)
Total Protein: 6.5 g/dL (ref 6.1–8.1)
eGFR: 56 mL/min/{1.73_m2} — ABNORMAL LOW

## 2024-11-27 LAB — CBC WITH DIFFERENTIAL/PLATELET
Absolute Lymphocytes: 1605 {cells}/uL (ref 850–3900)
Absolute Monocytes: 524 {cells}/uL (ref 200–950)
Basophils Absolute: 20 {cells}/uL (ref 0–200)
Basophils Relative: 0.3 %
Eosinophils Absolute: 367 {cells}/uL (ref 15–500)
Eosinophils Relative: 5.4 %
HCT: 48.8 % (ref 39.4–51.1)
Hemoglobin: 16.2 g/dL (ref 13.2–17.1)
MCH: 30.4 pg (ref 27.0–33.0)
MCHC: 33.2 g/dL (ref 31.6–35.4)
MCV: 91.6 fL (ref 81.4–101.7)
MPV: 11.5 fL (ref 7.5–12.5)
Monocytes Relative: 7.7 %
Neutro Abs: 4284 {cells}/uL (ref 1500–7800)
Neutrophils Relative %: 63 %
Platelets: 276 10*3/uL (ref 140–400)
RBC: 5.33 Million/uL (ref 4.20–5.80)
RDW: 12.6 % (ref 11.0–15.0)
Total Lymphocyte: 23.6 %
WBC: 6.8 10*3/uL (ref 3.8–10.8)

## 2024-11-27 LAB — LIPID PANEL
Cholesterol: 174 mg/dL
HDL: 40 mg/dL
LDL Cholesterol (Calc): 115 mg/dL — ABNORMAL HIGH
Non-HDL Cholesterol (Calc): 134 mg/dL — ABNORMAL HIGH
Total CHOL/HDL Ratio: 4.4 (calc)
Triglycerides: 90 mg/dL

## 2024-11-27 LAB — HEMOGLOBIN A1C
Hgb A1c MFr Bld: 5.4 %
Mean Plasma Glucose: 108 mg/dL
eAG (mmol/L): 6 mmol/L

## 2024-11-27 LAB — TSH: TSH: 4.07 m[IU]/L (ref 0.40–4.50)

## 2024-11-27 LAB — PSA: PSA: 0.66 ng/mL

## 2025-05-24 ENCOUNTER — Ambulatory Visit: Admitting: Family Medicine
